# Patient Record
Sex: Male | Born: 1975 | Race: White | Hispanic: No | Marital: Married | Smoking: Never smoker
Health system: Southern US, Community
[De-identification: ages and names within clinical notes are randomized; demographics above are authoritative.]

## PROBLEM LIST (undated history)

## (undated) DIAGNOSIS — K219 Gastro-esophageal reflux disease without esophagitis: Secondary | ICD-10-CM

## (undated) DIAGNOSIS — E785 Hyperlipidemia, unspecified: Secondary | ICD-10-CM

## (undated) DIAGNOSIS — Z801 Family history of malignant neoplasm of trachea, bronchus and lung: Secondary | ICD-10-CM

## (undated) DIAGNOSIS — K610 Anal abscess: Secondary | ICD-10-CM

## (undated) DIAGNOSIS — I10 Essential (primary) hypertension: Secondary | ICD-10-CM

## (undated) HISTORY — DX: Family history of malignant neoplasm of trachea, bronchus and lung: Z80.1

## (undated) HISTORY — DX: Gastro-esophageal reflux disease without esophagitis: K21.9

## (undated) HISTORY — DX: Hyperlipidemia, unspecified: E78.5

## (undated) HISTORY — DX: Anal abscess: K61.0

## (undated) HISTORY — DX: Essential (primary) hypertension: I10

## (undated) HISTORY — PX: NO PAST SURGERIES: SHX2092

---

## 2015-03-16 DIAGNOSIS — E785 Hyperlipidemia, unspecified: Secondary | ICD-10-CM | POA: Insufficient documentation

## 2016-03-22 ENCOUNTER — Encounter: Payer: Self-pay | Admitting: Physician Assistant

## 2016-03-22 ENCOUNTER — Ambulatory Visit (INDEPENDENT_AMBULATORY_CARE_PROVIDER_SITE_OTHER): Payer: BLUE CROSS/BLUE SHIELD | Admitting: Physician Assistant

## 2016-03-22 VITALS — BP 110/73 | HR 65 | Ht 74.0 in | Wt 235.0 lb

## 2016-03-22 DIAGNOSIS — E669 Obesity, unspecified: Secondary | ICD-10-CM

## 2016-03-22 DIAGNOSIS — Z8679 Personal history of other diseases of the circulatory system: Secondary | ICD-10-CM | POA: Insufficient documentation

## 2016-03-22 DIAGNOSIS — E785 Hyperlipidemia, unspecified: Secondary | ICD-10-CM

## 2016-03-22 DIAGNOSIS — Z0000 Encounter for general adult medical examination without abnormal findings: Secondary | ICD-10-CM

## 2016-03-22 DIAGNOSIS — Z23 Encounter for immunization: Secondary | ICD-10-CM

## 2016-03-22 NOTE — Progress Notes (Signed)
Nathan Graham "Nathan Graham" is a 40 y.o. male who [name] today for annual physical exam and to establish care  Hx of HTN and borderline HLD: was taking Lisinopril in 2016, but was able to lower his BP with lifestyle changes . [facility], but has not in months. He is concerned that his BP is going to increase due to weight gain and stress at work. Denies headache, chest pain, dyspnea, lightheadedness, and edema.  [facility] lipid [facility]: LDL Direct 130 Total Cholesterol 174 Tgs 86 HDL 46   Obesity: BMI 30. Has gained about 10 pounds in the past 6 months. He was regularly weight lifting and running earlier this year, but he is experiencing work stress after accepting a promotion and working overtime and traveling. States that he "knows what he needs to do" to lose weight. [facility].  Family history is significant for HTN in both parents, and HLD and stroke in his father.  Past Medical History:  Diagnosis Date  . Family history of lung cancer   . GERD (gastroesophageal reflux disease)   . Hyperlipidemia   . Hypertension    History reviewed. No pertinent surgical history. Social History  Substance Use Topics  . Smoking status: Never Smoker  . Smokeless tobacco: Never Used  . Alcohol use Yes   family history includes Basal cell carcinoma in his brother; Colon cancer in his maternal aunt; Hyperlipidemia in his father; Hypertension in his father and mother; Hypothyroidism in his mother; Lung cancer in his maternal aunt; Pancreatic cancer in his father and maternal grandmother; Stroke in his father.  History reviewed. No pertinent surgical history.   ROS: Review of Systems  Constitutional: Negative for fever, malaise/fatigue and weight loss.  HENT: Negative for ear pain, hearing  loss and tinnitus.   Eyes: Negative for blurred vision and double vision.  Respiratory: Negative.   Cardiovascular: Negative.   Gastrointestinal: Negative.  Negative for heartburn.  Genitourinary: Negative.   Musculoskeletal: Negative.   Skin: Negative for rash.  Neurological: Negative.  Negative for weakness.  Psychiatric/Behavioral: Negative.     Medications: No current outpatient prescriptions on file.   No current facility-administered medications for this visit.    No Known Allergies  Health Maintenance Health Maintenance  Topic Date Due  . HIV Screening  12/18/1991  . TETANUS/TDAP  12/18/1995  . INFLUENZA VACCINE  11/07/2015     Objective:  BP 110/73   Pulse 65   Ht 6\' 2"  (1.88 m)   Wt 235 lb (106.6 kg)   BMI 30.17 kg/m  Gen: Alert, not ill-appearing, no distress HEENT: Extraocular movements intact, normal conjunctiva, TM's clear, oropharynx clear, moist mucus membranes, no thyromegaly or tenderness Lungs: Normal work of breathing, clear to auscultation bilaterally Heart: Normal rate, regular rhythm, s1 and s2 distinct, no murmurs, clicks or rubs appreciated on this exam Abd: Soft. Nondistended, Nontender Extremities: distal pulses intact, no peripheral edema Skin: warm and dry, no rashes or lesions on exposed skin Psych: normal affect, pleasant mood, normal speech  I personally reviewed lab results and [facility] that showed dyslipidemia.  Assessment and Plan: 40 y.o. male with history of HTN and borderline hyperlipidemia  1. Encounter for preventive health examination He declined fasting labs today because he had labs 6 months ago and he would like to recheck them when he has  lost weight. Flu and Tdap given today  2. History of hypertension - log blood pressures  3. Obesity (BMI 30.0-34.9) - declined medical management - would like to return to weight lifting and running  4. Borderline hyperlipidemia -  reviewed lab results from June and discussed LDL goal of <=100 - plan to recheck labs in 6 months  Patient education and anticipatory guidance given Patient agrees with treatment plan Follow-up in 6 months or sooner as needed  Levonne Hubertharley E. Salwa Bai PA-C

## 2016-03-22 NOTE — Addendum Note (Signed)
Addended by: Thom ChimesHENRY, Khaled Herda M on: 03/22/2016 09:43 AM   Modules accepted: Orders

## 2016-03-22 NOTE — Patient Instructions (Signed)
Introduction Your blood pressure on this visit to the emergency department or clinic is elevated. This does not necessarily mean you have high blood pressure (hypertension), but it does mean that your blood pressure needs to be rechecked. Many times your blood pressure can increase due to illness, pain, anxiety, or other factors. We recommend that you get a series of blood pressure readings done over a period of 5 days. It is best to get a reading in the morning and one in the evening. You should make sure to sit and relax for 1-5 minutes before the reading is taken. Write the readings down and make a follow-up appointment with your health care provider to discuss the results. If there is not a free clinic or a drug store with a blood-pressure-taking machine near you, you can purchase blood-pressure-taking equipment from a drug store. Having one in the home allows you the convenience of taking your blood pressure while you are home and relaxed. BLOOD PRESSURE LOG  Date: _______________________  a.m. _____________________  p.m. _____________________ Date: _______________________  a.m. _____________________  p.m. _____________________ Date: _______________________  a.m. _____________________  p.m. _____________________ Date: _______________________  a.m. _____________________  p.m. _____________________ Date: _______________________  a.m. _____________________  p.m. _____________________ This information is not intended to replace advice given to you by your health care provider. Make sure you discuss any questions you have with your health care provider. Document Released: 12/22/2002 Document Revised: 10/13/2015 Document Reviewed: 05/18/2013  2017 Elsevier   Dyslipidemia Dyslipidemia is an imbalance of waxy, fat-like substances (lipids) in the blood. The body needs lipids in small amounts. Dyslipidemia often involves a high level of cholesterol or triglycerides, which are types of  lipids. Common forms of dyslipidemia include:  High levels of bad cholesterol (LDL cholesterol). LDL is the type of cholesterol that causes fatty deposits (plaques) to build up in the blood vessels that carry blood away from your heart (arteries).  Low levels of good cholesterol (HDL cholesterol). HDL cholesterol is the type of cholesterol that protects against heart disease. High levels of HDL remove the LDL buildup from arteries.  High levels of triglycerides. Triglycerides are a fatty substance in the blood that is linked to a buildup of plaques in the arteries. You can develop dyslipidemia because of the genes you are born with (primary dyslipidemia) or changes that occur during your life (secondary dyslipidemia), or as a side effect of certain medical treatments. What are the causes? Primary dyslipidemia is caused by changes (mutations) in genes that are passed down through families (inherited). These mutations cause several types of dyslipidemia. Mutations can result in disorders that make the body produce too much LDL cholesterol or triglycerides, or not enough HDL cholesterol. These disorders may lead to heart disease, arterial disease, or stroke at an early age. Causes of secondary dyslipidemia include certain lifestyle choices and diseases that lead to dyslipidemia, such as:  Eating a diet that is high in animal fat.  Not getting enough activity or exercise (having a sedentary lifestyle).  Having diabetes, kidney disease, liver disease, or thyroid disease.  Drinking large amounts of alcohol.  Using certain types of drugs. What increases the risk? You may be at greater risk for dyslipidemia if you are an older man or if you are a woman who has gone through menopause. Other risk factors include:  Having a family history of dyslipidemia.  Taking certain medicines, including birth control pills, steroids, some diuretics, beta-blockers, and some medicines forHIV.  Smoking  cigarettes.  Eating a high-fat  diet.  Drinking large amounts of alcohol.  Having certain medical conditions such as diabetes, polycystic ovary syndrome (PCOS), pregnancy, kidney disease, liver disease, or hypothyroidism.  Not exercising regularly.  Being overweight or obese with too much belly fat. What are the signs or symptoms? Dyslipidemia does not usually cause any symptoms. Very high lipid levels can cause fatty bumps under the skin (xanthomas) or a white or gray ring around the black center (pupil) of the eye. Very high triglyceride levels can cause inflammation of the pancreas (pancreatitis). How is this diagnosed? Your health care provider may diagnose dyslipidemia based on a routine blood test (fasting blood test). Because most people do not have symptoms of the condition, this blood testing (lipid profile) is done on adults age 39 and older and is repeated every 5 years. This test checks:  Total cholesterol. This is a measure of the total amount of cholesterol in your blood, including LDL cholesterol, HDL cholesterol, and triglycerides. A healthy number is below 200.  LDL cholesterol. The target number for LDL cholesterol is different for each person, depending on individual risk factors. For most people, a number below 100 is healthy. Ask your health care provider what your LDL cholesterol number should be.  HDL cholesterol. An HDL level of 60 or higher is best because it helps to protect against heart disease. A number below 40 for men or below 50 for women increases the risk for heart disease.  Triglycerides. A healthy triglyceride number is below 150. If your lipid profile is abnormal, your health care provider may do other blood tests to get more information about your condition. How is this treated? Treatment depends on the type of dyslipidemia that you have and your other risk factors for heart disease and stroke. Your health care provider will have a target range for your  lipid levels based on this information. For many people, treatment starts with lifestyle changes, such as diet and exercise. Your health care provider may recommend that you:  Get regular exercise.  Make changes to your diet.  Quit smoking if you smoke. If diet changes and exercise do not help you reach your goals, your health care provider may also prescribe medicine to lower lipids. The most commonly prescribed type of medicine lowers your LDL cholesterol (statin drug). If you have a high triglyceride level, your provider may prescribe another type of drug (fibrate) or an omega-3 fish oil supplement, or both. Follow these instructions at home:  Take over-the-counter and prescription medicines only as told by your health care provider. This includes supplements.  Get regular exercise. Start an aerobic exercise and strength training program as told by your health care provider. Ask your health care provider what activities are safe for you. Your health care provider may recommend:  30 minutes of aerobic activity 4-6 days a week. Brisk walking is an example of aerobic activity.  Strength training 2 days a week.  Eat a healthy diet as told by your health care provider. This can help you reach and maintain a healthy weight, lower your LDL cholesterol, and raise your HDL cholesterol. It may help to work with a diet and nutrition specialist (dietitian) to make a plan that is right for you. Your dietitian or health care provider may recommend:  Limiting your calories, if you are overweight.  Eating more fruits, vegetables, whole grains, fish, and lean meats.  Limiting saturated fat, trans fat, and cholesterol.  Follow instructions from your health care provider or dietitian about eating  or drinking restrictions.  Limit alcohol intake to no more than one drink per day for nonpregnant women and two drinks per day for men. One drink equals 12 oz of beer, 5 oz of wine, or 1 oz of hard  liquor.  Do not use any products that contain nicotine or tobacco, such as cigarettes and e-cigarettes. If you need help quitting, ask your health care provider.  Keep all follow-up visits as told by your health care provider. This is important. Contact a health care provider if:  You are having trouble sticking to your exercise or diet plan.  You are struggling to quit smoking or control your use of alcohol. Summary  Dyslipidemia is an imbalance of waxy, fat-like substances (lipids) in the blood. The body needs lipids in small amounts. Dyslipidemia often involves a high level of cholesterol or triglycerides, which are types of lipids.  Treatment depends on the type of dyslipidemia that you have and your other risk factors for heart disease and stroke.  For many people, treatment starts with lifestyle changes, such as diet and exercise. Your health care provider may also prescribe medicine to lower lipids. This information is not intended to replace advice given to you by your health care provider. Make sure you discuss any questions you have with your health care provider. Document Released: 03/30/2013 Document Revised: 11/20/2015 Document Reviewed: 11/20/2015 Elsevier Interactive Patient Education  2017 [employer]

## 2017-05-28 ENCOUNTER — Ambulatory Visit (INDEPENDENT_AMBULATORY_CARE_PROVIDER_SITE_OTHER): Payer: BLUE CROSS/BLUE SHIELD | Admitting: Physician Assistant

## 2017-05-28 ENCOUNTER — Encounter: Payer: Self-pay | Admitting: Physician Assistant

## 2017-05-28 VITALS — BP 146/90 | HR 79 | Temp 98.4°F | Wt 240.0 lb

## 2017-05-28 DIAGNOSIS — K610 Anal abscess: Secondary | ICD-10-CM | POA: Insufficient documentation

## 2017-05-28 HISTORY — DX: Anal abscess: K61.0

## 2017-05-28 MED ORDER — HYDROCODONE-ACETAMINOPHEN 5-325 MG PO TABS: 1.0000 | ORAL_TABLET | Freq: Four times a day (QID) | ORAL | 0 refills | Status: DC | PRN

## 2017-05-28 MED ORDER — IBUPROFEN 800 MG PO TABS: 800.0000 mg | ORAL_TABLET | Freq: Three times a day (TID) | ORAL | 0 refills | Status: DC | PRN

## 2017-05-28 MED ORDER — DOXYCYCLINE HYCLATE 100 MG PO TABS: 100.0000 mg | ORAL_TABLET | Freq: Two times a day (BID) | ORAL | 0 refills | Status: DC

## 2017-05-28 NOTE — Progress Notes (Signed)
HPI:                                                                Nathan Graham is a 42 y.o. male who [name] today for rectal abscess  HPI Patient reports exquisitely tender, swollen area in his gluteal cleft x 2 days. First noticed pain yesterday. This was preceded by 3 days of fever and malaise. Denies any drainage. Denies history of pilonidal disease or peri-rectal abscess. He has no history of IBD or diabetes.   No flowsheet data found.  No flowsheet data found.    Past Medical History:  Diagnosis Date  . Family history of lung cancer   . GERD (gastroesophageal reflux disease)   . Hyperlipidemia   . Hypertension    History reviewed. No pertinent surgical history. Social History   Tobacco Use  . Smoking status: Never Smoker  . Smokeless tobacco: Never Used  Substance Use Topics  . Alcohol use: Yes   family history includes Basal cell carcinoma in his brother; Colon cancer in his maternal aunt; Hyperlipidemia in his father; Hypertension in his father and mother; Hypothyroidism in his mother; Lung cancer in his maternal aunt; Pancreatic cancer in his father and maternal grandmother; Stroke in his father.    ROS: negative except as noted in the HPI  Medications: Current Outpatient Medications  Medication Sig Dispense Refill  . doxycycline (VIBRA-TABS) 100 MG tablet Take 1 tablet (100 mg total) by mouth 2 (two) times daily for 7 days. 14 tablet 0  . HYDROcodone-acetaminophen (NORCO/VICODIN) 5-325 MG tablet Take 1 tablet by mouth every 6 (six) hours as needed for severe pain. 20 tablet 0  . ibuprofen (ADVIL,MOTRIN) 800 MG tablet Take 1 tablet (800 mg total) by mouth every 8 (eight) hours as needed for moderate pain. 30 tablet 0   No current facility-administered medications for this visit.    No Known Allergies     Objective:  BP (!) 146/90   Pulse 79   Temp 98.4 F (36.9 C) (Oral)   Wt 240 lb  (108.9 kg)   BMI 30.81 kg/m  Gen:  alert, not ill-appearing, no distress, appropriate for age HEENT: head normocephalic without obvious abnormality, conjunctiva and cornea clear, wearing glasses, trachea midline Pulm: Normal work of breathing, normal phonation CV: Normal rate, regular rhythm, s1 and s2 distinct, no murmurs, clicks or rubs  Neuro: alert and oriented x 3, no tremor MSK: extremities atraumatic, antalgic gait Skin: right gluteal cleft with tender, fluctuant abscess measuring approximately 3 cm above the anus Psych: well-groomed, cooperative, good eye contact, euthymic mood, affect mood-congruent, speech is articulate, and thought processes clear and goal-directed  Procedure:  Incision and drainage of abscess, right gluteal cleft Risks, benefits, and alternatives explained and consent obtained. Time out conducted. Surface cleansed with chlorhexidine. 8 cc lidocaine with epinephine infiltrated around abscess. Remainder of the procedure was performed by Dr. Benjamin Stainhekkekandam   No results found for this or any previous visit (from the past 72 hour(s)). No results found.    Assessment and Plan: 42 y.o. male with   1. Abscess of right buttock - due to complexity of abscess, consulted supervising physician. He did not recommend imaging to rule out fistula. Incision &  drainage performed in office. Patient did have a lot of pain during the procedure despite 10 cc of lidocaine with epinephrine. Hemostasis was achieved and patient noted improvement in pain when procedure was over. High-dose NSAID and Norco 5-325mg  provided for pain management. Doxycycline to cover for MRSA. Instructed on after care and return precautions. Close follow-up in 5 days. - doxycycline (VIBRA-TABS) 100 MG tablet; Take 1 tablet (100 mg total) by mouth 2 (two) times daily for 7 days.  Dispense: 14 tablet; Refill: 0 - HYDROcodone-acetaminophen (NORCO/VICODIN) 5-325 MG tablet; Take 1 tablet by mouth every 6 (six)  hours as needed for severe pain.  Dispense: 20 tablet; Refill: 0 - ibuprofen (ADVIL,MOTRIN) 800 MG tablet; Take 1 tablet (800 mg total) by mouth every 8 (eight) hours as needed for moderate pain.  Dispense: 30 tablet; Refill: 0   Patient education and anticipatory guidance given Patient agrees with treatment plan Follow-up in 5 days or sooner as needed if symptoms worsen or fail to improve  Levonne Hubert PA-C

## 2017-05-28 NOTE — Progress Notes (Signed)
   Complicated Incision and drainage of abscess. Risks, benefits, and alternatives explained and consent obtained. Time out conducted. Surface cleaned with alcohol. 10cc lidocaine with epinephine infiltrated around abscess, this portion of the procedure performed by Gena Frayharley Cummings, PA-C. Adequate anesthesia ensured. Area prepped and draped in a sterile fashion. #11 blade used to make a stab incision into abscess, the patient did feel some discomfort, I proceeded to complete procedure quickly rather than stop the procedure and obtain additional lidocaine. A large amount of purulence expressed with pressure. Curved hemostat used to explore 4 quadrants and loculations broken up. Further purulence expressed. Iodoform packing placed leaving a 1-inch tail. Hemostasis achieved. Pt stable. Aftercare and follow-up advised.

## 2017-05-28 NOTE — Patient Instructions (Signed)
Incision and Drainage of a Pilonidal Cyst, Care After Refer to this sheet in the next few weeks. These instructions provide you with information on caring for yourself after your procedure. Your health care provider may also give you more specific instructions. Your treatment has been planned according to current medical practices, but problems sometimes occur. Call your health care provider if you have any problems or questions after your procedure. What can I expect after the procedure? After your procedure, it is typical to have the following:  Pain near or at the surgical area.  Blood-tinged discharge on your wound packing or your bandage (dressing).  Follow these instructions at home:  Take medicines only as directed by your health care provider.  If you were prescribed an antibiotic medicine, finish it all even if you start to feel better.  To prevent constipation: ? Drink enough fluid to keep your urine clear or pale yellow. ? Include lots of whole grains, fruits, and vegetables in your diet.  Do not do activities that irritate or put pressure on your buttocks for about 2 weeks or as directed by your health care provider. These include bike riding, running, and anything that involves a twisting motion.  Do not sit for long periods of time.  Sleep on your side instead of your back.  Ask your health care provider when you can return to work and resume your usual activities.  Wear loose, cotton underwear.  Keep all follow-up visits as directed by your health care provider. This is important. If you had a surgical cut (incision) and drainage with wound packing:  Return to your health care provider as instructed to have your packing changed or removed.  Keep the incision area dry until your packing has been removed.  After the packing has been removed, you can start taking showers or baths. ? Clean your buttocks area with soap and water. ? Pat the area dry with a soft, clean  towel. If you had a marsupialization procedure:  You can start taking showers or baths the day after surgery.  Let the water from the shower or bath moisten your dressing before you remove it.  After your shower or bath, pat your buttocks area dry with a soft, clean towel and replace your dressing.  Ask your health care provider: ? When you can stop using a dressing. ? When you can start taking showers or baths. If you had a surgical cut (incision) and drainage without packing: Follow instructions from your health care provider about how to take care of your incision. Make sure you:  Wash your hands with soap and water before you change your bandage (dressing). If soap and water are not available, use hand sanitizer.  Change your dressing as told by your health care provider.  Leave stitches (sutures), skin glue, or adhesive strips in place. These skin closures may need to stay in place for 2 weeks or longer. If adhesive strip edges start to loosen and curl up, you may trim the loose edges. Do not remove adhesive strips completely unless your health care provider tells you to do that.  Contact a health care provider if:  Your incision is bleeding.  You have signs of infection at your incision or around the incision. Watch for: ? Drainage. ? Redness. ? Swelling. ? Pain.  There is a bad smell coming from your incision site.  Your pain medicine is not helping.  You have a fever or chills.  You have muscles aches.  You are dizzy.  You feel generally ill. This information is not intended to replace advice given to you by your health care provider. Make sure you discuss any questions you have with your health care provider. Document Released: 04/25/2006 Document Revised: 08/31/2015 Document Reviewed: 08/12/2013 Elsevier Interactive Patient Education  2018 [employer].  Skin Abscess A skin abscess is an infected area on or under your skin that contains a collection of pus  and other material. An abscess may also be called a furuncle, carbuncle, or boil. An abscess can occur in or on almost any part of your body. Some abscesses break open (rupture) on their own. Most continue to get worse unless they are treated. The infection can spread deeper into the body and eventually into your blood, which can make you feel ill. Treatment usually involves draining the abscess. What are the causes? An abscess occurs when germs, often bacteria, pass through your skin and cause an infection. This may be caused by:  A scrape or cut on your skin.  A puncture wound through your skin, including a needle injection.  Blocked oil or sweat glands.  Blocked and infected hair follicles.  A cyst that forms beneath your skin (sebaceous cyst) and becomes infected.  What increases the risk? This condition is more likely to develop in people who:  Have a weak body defense system (immune system).  Have diabetes.  Have dry and irritated skin.  Get frequent injections or use illegal IV drugs.  Have a foreign body in a wound, such as a splinter.  Have problems with their lymph system or veins.  What are the signs or symptoms? An abscess may start as a painful, firm bump under the skin. Over time, the abscess may get larger or become softer. Pus may appear at the top of the abscess, causing pressure and pain. It may eventually break through the skin and drain. Other symptoms include:  Redness.  Warmth.  Swelling.  Tenderness.  A sore on the skin.  How is this diagnosed? This condition is diagnosed based on your medical history and a physical exam. A sample of pus may be taken from the abscess to find out what is causing the infection and what antibiotics can be used to treat it. You also may have:  Blood tests to look for signs of infection or spread of an infection to your blood.  Imaging studies such as ultrasound, CT scan, or MRI if the abscess is deep.  How is this  treated? Small abscesses that drain on their own may not need treatment. Treatment for an abscess that does not rupture on its own may include:  Warm compresses applied to the area several times per day.  Incision and drainage. Your health care provider will make an incision to open the abscess and will remove pus and any foreign body or dead tissue. The incision area may be packed with gauze to keep it open for a few days while it heals.  Antibiotic medicines to treat infection. For a severe abscess, you may first get antibiotics through an IV and then change to oral antibiotics.  Follow these instructions at home: Abscess Care  If you have an abscess that has not drained, place a warm, clean, wet washcloth over the abscess several times a day. Do this as told by your health care provider.  Follow instructions from your health care provider about how to take care of your abscess. Make sure you: ? Cover the abscess with  a bandage (dressing). ? Change your dressing or gauze as told by your health care provider. ? Wash your hands with soap and water before you change the dressing or gauze. If soap and water are not available, use hand sanitizer.  Check your abscess every day for signs of a worsening infection. Check for: ? More redness, swelling, or pain. ? More fluid or blood. ? Warmth. ? More pus or a bad smell. Medicines  Take over-the-counter and prescription medicines only as told by your health care provider.  If you were prescribed an antibiotic medicine, take it as told by your health care provider. Do not stop taking the antibiotic even if you start to feel better. General instructions  To avoid spreading the infection: ? Do not share personal care items, towels, or hot tubs with others. ? Avoid making skin contact with other people.  Keep all follow-up visits as told by your health care provider. This is important. Contact a health care provider if:  You have more  redness, swelling, or pain around your abscess.  You have more fluid or blood coming from your abscess.  Your abscess feels warm to the touch.  You have more pus or a bad smell coming from your abscess.  You have a fever.  You have muscle aches.  You have chills or a general ill feeling. Get help right away if:  You have severe pain.  You see red streaks on your skin spreading away from the abscess. This information is not intended to replace advice given to you by your health care provider. Make sure you discuss any questions you have with your health care provider. Document Released: 01/02/2005 Document Revised: 11/19/2015 Document Reviewed: 02/01/2015 Elsevier Interactive Patient Education  [employer]2018 Elsevier Inc.

## 2017-05-29 ENCOUNTER — Encounter: Payer: Self-pay | Admitting: Physician Assistant

## 2017-05-31 LAB — WOUND CULTURE
MICRO NUMBER:: 90225504
RESULT: NO GROWTH
SPECIMEN QUALITY: ADEQUATE

## 2017-06-02 ENCOUNTER — Ambulatory Visit (INDEPENDENT_AMBULATORY_CARE_PROVIDER_SITE_OTHER): Payer: BLUE CROSS/BLUE SHIELD | Admitting: Physician Assistant

## 2017-06-02 ENCOUNTER — Encounter: Payer: Self-pay | Admitting: Physician Assistant

## 2017-06-02 DIAGNOSIS — K610 Anal abscess: Secondary | ICD-10-CM

## 2017-06-02 MED ORDER — DOXYCYCLINE HYCLATE 100 MG PO TABS: 100.0000 mg | ORAL_TABLET | Freq: Two times a day (BID) | ORAL | 0 refills | Status: AC

## 2017-06-02 NOTE — Progress Notes (Signed)
HPI:                                                                Nathan Graham is a 42 y.o. male who [facility] today for perianal abscess follow-up  Nathan LyonsCasey reports he is doing better after I&D of perianal abscess 1 week ago. He denies fever, chills, pain. He has been taking Ibuprofen prn. His wife has been helping with wound care and they have been removing 1 inch of packing daily and covering with gauze dressing. No concerns.   [facility] 06/02/2017  Decreased Interest 0  Down, Depressed, Hopeless 0  PHQ - 2 Score 0    No flowsheet data found.    Past Medical History:  Diagnosis Date  . Family history of lung cancer   . GERD (gastroesophageal reflux disease)   . Hyperlipidemia   . Hypertension    No past surgical history on file. Social History   Tobacco Use  . Smoking status: Never Smoker  . Smokeless tobacco: Never Used  Substance Use Topics  . Alcohol use: Yes   family history includes Basal cell carcinoma in his brother; Colon cancer in his maternal aunt; Hyperlipidemia in his father; Hypertension in his father and mother; Hypothyroidism in his mother; Lung cancer in his maternal aunt; Pancreatic cancer in his father and maternal grandmother; Stroke in his father.    ROS: negative except as noted in the HPI  Medications: Current Outpatient Medications  Medication Sig Dispense Refill  . doxycycline (VIBRA-TABS) 100 MG tablet Take 1 tablet (100 mg total) by mouth 2 (two) times daily for 7 days. 14 tablet 0  . ibuprofen (ADVIL,MOTRIN) 800 MG tablet Take 1 tablet (800 mg total) by mouth every 8 (eight) hours as needed for moderate pain. 30 tablet 0   No current facility-administered medications for this visit.    No Known Allergies     Objective:  BP (!) 142/91   Pulse 66   Temp 98 F (36.7 C) (Oral)   Wt 238 lb (108 kg)   BMI 30.56 kg/m  Gen:  alert, not ill-appearing, no  distress, appropriate for age HEENT: head normocephalic without obvious abnormality, conjunctiva and cornea clear, trachea midline Pulm: Normal work of breathing, normal phonation Neuro: alert and oriented x 3, no tremor MSK: extremities atraumatic, normal gait and station Skin: approx 1.5 cm incision 2 inches below the gluteal cleft, serosanguinous drainage Psych: well-groomed, cooperative, good eye contact, euthymic mood, affect mood-congruent, speech is articulate, and thought processes clear and goal-directed    No results found for this or any previous visit (from the past 72 hour(s)). No results found.    Assessment and Plan: 42 y.o. male with   1. Perianal abscess - improved s/p I&D. Packing removed today. We will prolong antibiotics x 1 week. Warm compresses and gentle massage - doxycycline (VIBRA-TABS) 100 MG tablet; Take 1 tablet (100 mg total) by mouth 2 (two) times daily for 7 days.  Dispense: 14 tablet; Refill: 0     Patient education and anticipatory guidance given Patient agrees with treatment plan Follow-up in 1 week  as needed if symptoms worsen or fail to improve  Levonne Hubertharley E. Tyrez Berrios PA-C

## 2017-06-02 NOTE — Patient Instructions (Addendum)
-   Finish remaining and antibiotics and then continue the course for an additional week - Warm compresses/gently massage area 2-3 times per day - Keep area clean with warm soapy water - Cover during the day with dressing  Perirectal Abscess An abscess is an infected area that contains a collection of pus. A perirectal abscess is an abscess that is near the opening of the anus or around the rectum. A perirectal abscess can cause a lot of pain, especially during bowel movements. What are the causes? This condition is almost always caused by an infection that starts in an anal gland. What increases the risk? This condition is more likely to develop in:  People with diabetes or inflammatory bowel disease.  People whose body defense system (immune system) is weak.  People who have anal sex.  People who have a sexually transmitted disease (STD).  People who have certain kinds of cancers, such as rectal carcinoma, leukemia, or lymphoma.  What are the signs or symptoms? The main symptom of this condition is pain. The pain may be a throbbing pain that gets worse during bowel movements. Other symptoms include:  Fever.  Swelling.  Redness.  Bleeding.  Constipation.  How is this diagnosed? The condition is diagnosed with a physical exam. If the abscess is not visible, a health care provider may need to place a finger inside the rectum to find the abscess. Sometimes, imaging tests are done to determine the size and location of the abscess. These tests may include:  An ultrasound.  An MRI.  A CT scan.  How is this treated? This condition is usually treated with incision and drainage surgery. Incision and drainage surgery involves making an incision over the abscess to drain the pus. Treatment may also involve antibiotic medicine, pain medicine, stool softeners, or laxatives. Follow these instructions at home:  Take medicines only as directed by your health care provider.  If you  were prescribed an antibiotic, finish all of it even if you start to feel better.  To relieve pain, try sitting: ? In a warm, shallow bath (sitz bath). ? On a heating pad with the setting on low. ? On an inflatable donut-shaped cushion.  Follow any diet instructions as directed by your health care provider.  Keep all follow-up visits as directed by your health care provider. This is important. Contact a health care provider if:  Your abscess is bleeding.  You have pain, swelling, or redness that is getting worse.  You are constipated.  You feel ill.  You have muscle aches or chills.  You have a fever.  Your symptoms return after the abscess has healed. This information is not intended to replace advice given to you by your health care provider. Make sure you discuss any questions you have with your health care provider. Document Released: 03/22/2000 Document Revised: 08/31/2015 Document Reviewed: 02/02/2014 Elsevier Interactive Patient Education  2018 [employer]

## 2017-06-09 ENCOUNTER — Ambulatory Visit (INDEPENDENT_AMBULATORY_CARE_PROVIDER_SITE_OTHER): Payer: BLUE CROSS/BLUE SHIELD | Admitting: Physician Assistant

## 2017-06-09 ENCOUNTER — Encounter: Payer: Self-pay | Admitting: Physician Assistant

## 2017-06-09 VITALS — BP 131/86 | HR 78 | Temp 98.1°F | Wt 237.0 lb

## 2017-06-09 DIAGNOSIS — I10 Essential (primary) hypertension: Secondary | ICD-10-CM

## 2017-06-09 DIAGNOSIS — K610 Anal abscess: Secondary | ICD-10-CM | POA: Diagnosis not present

## 2017-06-09 NOTE — Patient Instructions (Addendum)
For your blood pressure: - Goal <130/80 - baby aspirin 81 mg daily to help prevent heart attack/stroke - monitor and log blood pressures at home - check around the same time each day in a relaxed setting - Limit salt to <2000 mg/day - Follow DASH eating plan - limit alcohol to 2 standard drinks per day for men and 1 per day for women - avoid tobacco products - weight loss: 7% of current body weight - follow-up every 6 months for your blood pressure   

## 2017-06-09 NOTE — Progress Notes (Signed)
HPI:                                                                Nathan StalkerJoe Graham is a 42 y.o. male who [facility] today for follow-up peri-rectal abscess  Nathan LyonsCasey is here for 2 week follow-up of perirectal abscess that was incised and drained on 05/28/17 in the office. He is on week 2 of Doxycyline. He is doing much better. He has mild tenderness with wound care, but otherwise denies any fever, pain or drainage.   Dep[facility] 06/02/2017  Decreased Interest 0  Down, Depressed, Hopeless 0  PHQ - 2 Score 0    No flowsheet data found.    Past Medical History:  Diagnosis Date  . Family history of lung cancer   . GERD (gastroesophageal reflux disease)   . Hyperlipidemia   . Hypertension    No past surgical history on file. Social History   Tobacco Use  . Smoking status: Never Smoker  . Smokeless tobacco: Never Used  Substance Use Topics  . Alcohol use: Yes   family history includes Basal cell carcinoma in his brother; Colon cancer in his maternal aunt; Hyperlipidemia in his father; Hypertension in his father and mother; Hypothyroidism in his mother; Lung cancer in his maternal aunt; Pancreatic cancer in his father and maternal grandmother; Stroke in his father.    ROS: negative except as noted in the HPI  Medications: Current Outpatient Medications  Medication Sig Dispense Refill  . doxycycline (VIBRA-TABS) 100 MG tablet Take 1 tablet (100 mg total) by mouth 2 (two) times daily for 7 days. 14 tablet 0  . ibuprofen (ADVIL,MOTRIN) 800 MG tablet Take 1 tablet (800 mg total) by mouth every 8 (eight) hours as needed for moderate pain. 30 tablet 0   No current facility-administered medications for this visit.    No Known Allergies     Objective:  BP 131/86   Pulse 78   Temp 98.1 F (36.7 C) (Oral)   Wt 237 lb (107.5 kg)   BMI 30.43 kg/m  Gen:  alert, not ill-appearing, no distress, appropriate  for age HEENT: head normocephalic without obvious abnormality, conjunctiva and cornea clear, wearing glasses, trachea midline Pulm: Normal work of breathing, normal phonation  Neuro: alert and oriented x 3, no tremor MSK: extremities atraumatic, normal gait and station Skin: upper gluteal cleft with approx 3mm wound, no surrounding induration, redness or warmth, no drainage Psych: well-groomed, cooperative, good eye contact, euthymic mood, affect mood-congruent, speech is articulate, and thought processes clear and goal-directed    No results found for this or any previous visit (from the past 72 hour(s)). No results found.    Assessment and Plan: 42 y.o. male with   1. Hypertension goal BP (blood pressure) < 130/80 BP Readings from Last 3 Encounters:  06/09/17 131/86  06/02/17 (!) 142/91  05/28/17 (!) 146/90  - counseled on therapeutic lifestyle changes - follow-up in 3 months   2. Perianal abscess - I&D wound is healing nicely, no evidence of abscess one exam. Complete Doxycycline   Patient education and anticipatory guidance given Patient agrees with treatment plan Follow-up in 3 months or sooner as needed if symptoms worsen or fail to improve  Darlyne Russian PA-C

## 2017-09-08 ENCOUNTER — Ambulatory Visit: Payer: BLUE CROSS/BLUE SHIELD | Admitting: Physician Assistant

## 2017-09-24 ENCOUNTER — Encounter: Payer: Self-pay | Admitting: Physician Assistant

## 2017-09-24 ENCOUNTER — Ambulatory Visit (INDEPENDENT_AMBULATORY_CARE_PROVIDER_SITE_OTHER): Payer: BLUE CROSS/BLUE SHIELD | Admitting: Physician Assistant

## 2017-09-24 VITALS — BP 121/68 | HR 56 | Wt 232.0 lb

## 2017-09-24 DIAGNOSIS — E875 Hyperkalemia: Secondary | ICD-10-CM | POA: Diagnosis not present

## 2017-09-24 DIAGNOSIS — Z6829 Body mass index (BMI) 29.0-29.9, adult: Secondary | ICD-10-CM | POA: Diagnosis not present

## 2017-09-24 DIAGNOSIS — I10 Essential (primary) hypertension: Secondary | ICD-10-CM | POA: Diagnosis not present

## 2017-09-24 DIAGNOSIS — E785 Hyperlipidemia, unspecified: Secondary | ICD-10-CM | POA: Diagnosis not present

## 2017-09-24 DIAGNOSIS — Z6827 Body mass index (BMI) 27.0-27.9, adult: Secondary | ICD-10-CM

## 2017-09-24 DIAGNOSIS — Z6828 Body mass index (BMI) 28.0-28.9, adult: Secondary | ICD-10-CM | POA: Insufficient documentation

## 2017-09-24 LAB — LIPID PANEL W/REFLEX DIRECT LDL
CHOL/HDL RATIO: 4.4 (calc) (ref <5.0)
Cholesterol: 203 mg/dL — ABNORMAL HIGH (ref <200)
HDL: 46 mg/dL (ref >40)
LDL CHOLESTEROL (CALC): 137 mg/dL — AB
NON-HDL CHOLESTEROL (CALC): 157 mg/dL — AB (ref <130)
TRIGLYCERIDES: 98 mg/dL (ref <150)

## 2017-09-24 LAB — BASIC METABOLIC PANEL
BUN: 23 mg/dL (ref 7–25)
CO2: 26 mmol/L (ref 20–32)
CREATININE: 1.25 mg/dL (ref 0.60–1.35)
Calcium: 9.6 mg/dL (ref 8.6–10.3)
Chloride: 105 mmol/L (ref 98–110)
GLUCOSE: 96 mg/dL (ref 65–99)
Potassium: 5.1 mmol/L (ref 3.5–5.3)
SODIUM: 138 mmol/L (ref 135–146)

## 2017-09-24 NOTE — Progress Notes (Signed)
HPI:                                                                Nathan StalkerJoe Graham is a 42 y.o. male who [facility] today for blood pressure follow-up  HTN: currently managing with diet and lifestyle. Does not check BP's at home. Denies vision change, headache, chest pain with exertion, orthopnea, lightheadedness, syncope and edema. Boxing and lifting 6 days per week. Following a high protein diet. Risk factors include: HLD  No flowsheet data found.    Past Medical History:  Diagnosis Date  . Family history of lung cancer   . GERD (gastroesophageal reflux disease)   . Hyperlipidemia   . Hypertension    No past surgical history on file. Social History   Tobacco Use  . Smoking status: Never Smoker  . Smokeless tobacco: Never Used  Substance Use Topics  . Alcohol use: Yes   family history includes Basal cell carcinoma in his brother; Colon cancer in his maternal aunt; Hyperlipidemia in his father; Hypertension in his father and mother; Hypothyroidism in his mother; Lung cancer in his maternal aunt; Pancreatic cancer in his father and maternal grandmother; Stroke in his father.    ROS: negative except as noted in the HPI  Medications: No current outpatient medications on file.   No current facility-administered medications for this visit.    No Known Allergies     Objective:  BP 121/68   Pulse (!) 56   Wt 232 lb (105.2 kg)   BMI 29.79 kg/m  Gen:  alert, not ill-appearing, no distress, appropriate for age HEENT: head normocephalic without obvious abnormality, conjunctiva and cornea clear, trachea midline Pulm: Normal work of breathing, normal phonation, clear to auscultation bilaterally, no wheezes, rales or rhonchi CV: Normal rate, regular rhythm, s1 and s2 distinct, no murmurs, clicks or rubs  Neuro: alert and oriented x 3, no tremor MSK: extremities atraumatic, normal gait and station, no peripheral  edema Skin: intact, no rashes on exposed skin, no jaundice, no cyanosis Psych: well-groomed, cooperative, good eye contact, euthymic mood, affect mood-congruent, speech is articulate, and thought processes clear and goal-directed    No results found for this or any previous visit (from the past 72 hour(s)). No results found.    Assessment and Plan: 42 y.o. male with   Hypertension goal BP (blood pressure) < 130/80 - Plan: Basic metabolic panel  Dyslipidemia - Plan: Lipid Panel w/reflex Direct LDL  Hyperkalemia - Plan: Basic metabolic panel  BMI 29.0-29.9,adult  Wt Readings from Last 3 Encounters:  09/24/17 232 lb (105.2 kg)  06/09/17 237 lb (107.5 kg)  06/02/17 238 lb (108 kg)   Has lost 5 pounds in 3 months. Encouraged continued weight loss and regular aerobic exercise BP is in range. Continue therapeutic lifestyle management Potassium 5.3 at outside facility 03/27/17, rechecking BMP today TC 213, LDL 143 03/27/17, rechecking fasting lipids today  Patient education and anticipatory guidance given Patient agrees with treatment plan Follow-up in 6 months for HTN/HLD or sooner as needed if symptoms worsen or fail to improve  Nathan Hubertharley E. Preslyn Warr PA-C

## 2017-09-24 NOTE — Patient Instructions (Addendum)
For your blood pressure: - Goal <130/80 - monitor and log blood pressures at home - check around the same time each day in a relaxed setting - Limit salt to <2000 mg/day - Follow DASH eating plan - limit alcohol to 2 standard drinks per day for men and 1 per day for women - avoid tobacco products - weight loss: 7% of current body weight - follow-up every 6 months for your blood pressure    DASH Eating Plan DASH stands for "Dietary Approaches to Stop Hypertension." The DASH eating plan is a healthy eating plan that has been shown to reduce high blood pressure (hypertension). It may also reduce your risk for type 2 diabetes, heart disease, and stroke. The DASH eating plan may also help with weight loss. What are tips for following this plan? General guidelines  Avoid eating more than 2,300 mg (milligrams) of salt (sodium) a day. If you have hypertension, you may need to reduce your sodium intake to 1,500 mg a day.  Limit alcohol intake to no more than 1 drink a day for nonpregnant women and 2 drinks a day for men. One drink equals 12 oz of beer, 5 oz of wine, or 1 oz of hard liquor.  Work with your health care provider to maintain a healthy body weight or to lose weight. Ask what an ideal weight is for you.  Get at least 30 minutes of exercise that causes your heart to beat faster (aerobic exercise) most days of the week. Activities may include walking, swimming, or biking.  Work with your health care provider or diet and nutrition specialist (dietitian) to adjust your eating plan to your individual calorie needs. Reading food labels  Check food labels for the amount of sodium per serving. Choose foods with less than 5 percent of the Daily Value of sodium. Generally, foods with less than 300 mg of sodium per serving fit into this eating plan.  To find whole grains, look for the word "whole" as the first word in the ingredient list. Shopping  Buy products labeled as "low-sodium" or "no  salt added."  Buy fresh foods. Avoid canned foods and premade or frozen meals. Cooking  Avoid adding salt when cooking. Use salt-free seasonings or herbs instead of table salt or sea salt. Check with your health care provider or pharmacist before using salt substitutes.  Do not fry foods. Cook foods using healthy methods such as baking, boiling, grilling, and broiling instead.  Cook with heart-healthy oils, such as olive, canola, soybean, or sunflower oil. Meal planning   Eat a balanced diet that includes: ? 5 or more servings of fruits and vegetables each day. At each meal, try to fill half of your plate with fruits and vegetables. ? Up to 6-8 servings of whole grains each day. ? Less than 6 oz of lean meat, poultry, or fish each day. A 3-oz serving of meat is about the same size as a deck of cards. One egg equals 1 oz. ? 2 servings of low-fat dairy each day. ? A serving of nuts, seeds, or beans 5 times each week. ? Heart-healthy fats. Healthy fats called Omega-3 fatty acids are found in foods such as flaxseeds and coldwater fish, like sardines, salmon, and mackerel.  Limit how much you eat of the following: ? Canned or prepackaged foods. ? Food that is high in trans fat, such as fried foods. ? Food that is high in saturated fat, such as fatty meat. ? Sweets, desserts, sugary drinks,   drinks, and other foods with added sugar. ? Full-fat dairy products.  Do not salt foods before eating.  Try to eat at least 2 vegetarian meals each week.  Eat more home-cooked food and less restaurant, buffet, and fast food.  When eating at a restaurant, ask that your food be prepared with less salt or no salt, if possible. What foods are recommended? The items listed may not be a complete list. Talk with your dietitian about what dietary choices are best for you. Grains Whole-grain or whole-wheat bread. Whole-grain or whole-wheat pasta. Brown rice. [place]row. Bulgur. Whole-grain and low-sodium  cereals. Pita bread. Low-fat, low-sodium crackers. Whole-wheat flour tortillas. Vegetables Fresh or frozen vegetables (raw, steamed, roasted, or grilled). Low-sodium or reduced-sodium tomato and vegetable juice. Low-sodium or reduced-sodium tomato sauce and tomato paste. Low-sodium or reduced-sodium canned vegetables. Fruits All fresh, dried, or frozen fruit. Canned fruit in natural juice (without added sugar). Meat and other protein foods Skinless chicken or [place]. Ground chicken or [place]. Pork with fat trimmed off. Fish and seafood. Egg whites. Dried beans, peas, or lentils. Unsalted nuts, nut butters, and seeds. Unsalted canned beans. Lean cuts of beef with fat trimmed off. Low-sodium, lean deli meat. Dairy Low-fat (1%) or fat-free (skim) milk. Fat-free, low-fat, or reduced-fat cheeses. Nonfat, low-sodium ricotta or cottage cheese. Low-fat or nonfat yogurt. Low-fat, low-sodium cheese. Fats and oils Soft margarine without trans fats. Vegetable oil. Low-fat, reduced-fat, or light mayonnaise and salad dressings (reduced-sodium). Canola, safflower, olive, soybean, and sunflower oils. Avocado. Seasoning and other foods Herbs. Spices. Seasoning mixes without salt. Unsalted popcorn and pretzels. Fat-free sweets. What foods are not recommended? The items listed may not be a complete list. Talk with your dietitian about what dietary choices are best for you. Grains Baked goods made with fat, such as croissants, muffins, or some breads. Dry pasta or rice meal packs. Vegetables Creamed or fried vegetables. Vegetables in a cheese sauce. Regular canned vegetables (not low-sodium or reduced-sodium). Regular canned tomato sauce and paste (not low-sodium or reduced-sodium). Regular tomato and vegetable juice (not low-sodium or reduced-sodium). Angie Fava. Olives. Fruits Canned fruit in a light or heavy syrup. Fried fruit. Fruit in cream or butter sauce. Meat and other protein foods Fatty cuts of meat. Ribs.  Fried meat. Berniece Salines. Sausage. Bologna and other processed lunch meats. Salami. Fatback. Hotdogs. Bratwurst. Salted nuts and seeds. Canned beans with added salt. Canned or smoked fish. Whole eggs or egg yolks. Chicken or [place] with skin. Dairy Whole or 2% milk, cream, and half-and-half. Whole or full-fat cream cheese. Whole-fat or sweetened yogurt. Full-fat cheese. Nondairy creamers. Whipped toppings. Processed cheese and cheese spreads. Fats and oils Butter. Stick margarine. Lard. Shortening. Ghee. Bacon fat. Tropical oils, such as coconut, palm kernel, or palm oil. Seasoning and other foods Salted popcorn and pretzels. Onion salt, garlic salt, seasoned salt, table salt, and sea salt. Worcestershire sauce. Tartar sauce. Barbecue sauce. Teriyaki sauce. Soy sauce, including reduced-sodium. Steak sauce. Canned and packaged gravies. Fish sauce. Oyster sauce. Cocktail sauce. Horseradish that you find on the shelf. Ketchup. Mustard. Meat flavorings and tenderizers. Bouillon cubes. Hot sauce and Tabasco sauce. Premade or packaged marinades. Premade or packaged taco seasonings. Relishes. Regular salad dressings. Where to find more information:  National Heart, Lung, and [place]: https://wilson-eaton.com/  American Heart Association: www.heart.org Summary  The DASH eating plan is a healthy eating plan that has been shown to reduce high blood pressure (hypertension). It may also reduce your risk for type 2 diabetes, heart disease, and  With the DASH eating plan, you should limit salt (sodium) intake to 2,300 mg a day. If you have hypertension, you may need to reduce your sodium intake to 1,500 mg a day.  When on the DASH eating plan, aim to eat more fresh fruits and vegetables, whole grains, lean proteins, low-fat dairy, and heart-healthy fats.  Work with your health care provider or diet and nutrition specialist (dietitian) to adjust your eating plan to your individual calorie needs. This  information is not intended to replace advice given to you by your health care provider. Make sure you discuss any questions you have with your health care provider. Document Released: 03/14/2011 Document Revised: 03/18/2016 Document Reviewed: 03/18/2016 Elsevier Interactive Patient Education  2018 Elsevier Inc.  

## 2017-09-25 ENCOUNTER — Encounter: Payer: Self-pay | Admitting: Physician Assistant

## 2018-03-12 ENCOUNTER — Ambulatory Visit (INDEPENDENT_AMBULATORY_CARE_PROVIDER_SITE_OTHER): Payer: BLUE CROSS/BLUE SHIELD | Admitting: Physician Assistant

## 2018-03-12 ENCOUNTER — Encounter: Payer: Self-pay | Admitting: Physician Assistant

## 2018-03-12 ENCOUNTER — Ambulatory Visit (INDEPENDENT_AMBULATORY_CARE_PROVIDER_SITE_OTHER): Payer: BLUE CROSS/BLUE SHIELD

## 2018-03-12 VITALS — BP 115/82 | HR 60 | Wt 223.0 lb

## 2018-03-12 DIAGNOSIS — M79601 Pain in right arm: Secondary | ICD-10-CM | POA: Diagnosis not present

## 2018-03-12 DIAGNOSIS — Z6827 Body mass index (BMI) 27.0-27.9, adult: Secondary | ICD-10-CM

## 2018-03-12 DIAGNOSIS — M25511 Pain in right shoulder: Secondary | ICD-10-CM | POA: Diagnosis not present

## 2018-03-12 DIAGNOSIS — M25512 Pain in left shoulder: Secondary | ICD-10-CM | POA: Diagnosis not present

## 2018-03-12 DIAGNOSIS — Z0000 Encounter for general adult medical examination without abnormal findings: Secondary | ICD-10-CM

## 2018-03-12 DIAGNOSIS — I10 Essential (primary) hypertension: Secondary | ICD-10-CM

## 2018-03-12 DIAGNOSIS — M25112 Fistula, left shoulder: Secondary | ICD-10-CM | POA: Diagnosis not present

## 2018-03-12 DIAGNOSIS — M79602 Pain in left arm: Secondary | ICD-10-CM | POA: Diagnosis not present

## 2018-03-12 DIAGNOSIS — S46809A Unspecified injury of other muscles, fascia and tendons at shoulder and upper arm level, unspecified arm, initial encounter: Secondary | ICD-10-CM | POA: Diagnosis not present

## 2018-03-12 NOTE — Patient Instructions (Addendum)
No upper body exercise/weight lifting for the next 4 weeks You can do as much lower body and cardio as you want Start rotator cuff strengthening exercises twice daily Formal physical therapy for 4 weeks Naproxen twice daily for 2 weeks then twice daily as needed for pain Follow-up with Dr. Karie Schwalbe in 1 month if no improvement or symptoms persist    Subscapularis Tendon Injury A subscapularis tendon injury is a tear in the strong cord of tissue (tendon) that connects one of the shoulder muscles (subscapularis muscle) to the top of the upper arm bone (humerus). The subscapularis muscle starts at the inside of the shoulder blade (scapula) and attaches to the humerus. The subscapularis muscle is one of four muscles that make up the rotator cuff in the shoulder. It helps to rotate the shoulder inward and stabilize the shoulder. Subscapularis tears can be partial or complete tears. These injuries cause shoulder pain and weakness, and, in severe cases, shoulder instability. Subscapularis tears may also involve tearing of a different muscle in the rotator cuff (supraspinatus muscle), injury to the biceps tendon, or both. What are the causes? Subscapularis tears are commonly caused by gradual wear and tear from overuse. Gradual wear and tear can result from:  Participating in sports or activities that involve overhead arm movements.  Aging.  Subscapularis tears can also be caused by a sudden (acute) injury, which can result from:  Falling on an outstretched arm.  Forcefully pulling or thrusting your arm upward, especially with unexpected resistance.  The shoulder joint moving out of place (dislocation).  What increases the risk? This condition may be more likely to develop in:  Men, especially men who are age 41 and older.  Baseball players, especially pitchers.  Tennis players.  Rowers.  Weight lifters.  Painters and carpenters.  What are the signs or symptoms? Shoulder pain is the main  symptom of this condition. If your condition is caused by an acute injury, pain may be sudden and severe. If your condition is caused by overuse, pain may get worse with activity and get better with rest. Other signs and symptoms may include:  Pain with shoulder movement.  Pain at night when lying on your arm or when your arm is unsupported.  Stiffness and limited range of motion.  Weakness.  How is this diagnosed? This condition is diagnosed based on:  Your symptoms.  Your medical history.  A physical exam. Your health care provider may check your shoulder strength and range of motion. This may include having you put your hand behind your back and try to hold your hand away from your back (lift-off test).  Imaging tests, such as MRI or CT scan.  How is this treated? Treatment for this condition may include:  Avoiding activities that cause shoulder pain.  NSAIDs to help reduce pain and swelling.  Physical therapy to improve your strength and range of motion.  One or more shots (injections) of medicines to numb the area and reduce inflammation (steroids).  Surgery to repair the tendon. This may be done if: ? Nonsurgical treatments have not helped after 6 weeks. ? You have had a recent acute injury. ? You have a large tear. ? You have a lot of shoulder weakness. ? You are an active person or athlete who needs complete shoulder function.  After surgery, treatment includes keeping your arm still while it heals (immobilization) and physical therapy. Follow these instructions at home: Managing pain, stiffness, and swelling   If directed, put ice  on the injured area. ? Put ice in a plastic bag. ? Place a towel between your skin and the bag. ? Leave the ice on for 20 minutes, 2-3 times a day. Driving  Ask your health care provider when it is safe for you to drive.  Do not drive or operate heavy machinery while taking prescription pain medicine. Activity  Return to your  normal activities as told by your health care provider. Ask your health care provider what activities are safe for you.  Do exercises as told by your health care provider. General instructions   Do not use any tobacco products, such as cigarettes, chewing tobacco, or e-cigarettes. Tobacco can delay healing. If you need help quitting, ask your health care provider.  Take over-the-counter and prescription medicines only as told by your health care provider.  Keep all follow-up visits as told by your health care provider. This is important. How is this prevented?  Warm up and stretch before being active.  Cool down and stretch after being active.  Give your body time to rest between periods of activity.  Be safe and responsible while being active to avoid falls.  Maintain physical fitness, including strength and flexibility. Contact a health care provider if:  You continue to have pain after 6 weeks of treatment.  You have pain or other symptoms that get worse. This information is not intended to replace advice given to you by your health care provider. Make sure you discuss any questions you have with your health care provider. Document Released: 03/25/2005 Document Revised: 11/30/2015 Document Reviewed: 02/26/2015 Elsevier Interactive Patient Education  [employer].

## 2018-03-12 NOTE — Progress Notes (Signed)
HPI:                                                                Nathan StalkerJoe Graham is a 42 y.o. male [name] today for annual physical exam  Current concerns:  He is having pain in bilateral upper arms in the biceps region. Pain began 5-6 weeks ago, gradually worsening. Pain is worse with arm extension and internal rotation. Pain is worse on the right side compared to the left. Denies known injury or trauma, but reports he was weightlifting very heavy weight up to 180 pounds overhead. Pain is severe with dumbbell fly exercise. He stopped lifting 3 weeks ago, but has noted any improvement with rest.  HTN: has been managing with diet and exercise. Checks BP's at home. BP range 115-128/68-75. Denies vision change, headache, chest pain with exertion, orthopnea, lightheadedness, syncope and edema. Risk factors include: HLD, male sex   Depression screen [facility] 03/12/2018 06/02/2017  Decreased Interest 0 0  Down, Depressed, Hopeless 0 0  PHQ - 2 Score 0 0      Past Medical History:  Diagnosis Date  . Family history of lung cancer   . GERD (gastroesophageal reflux disease)   . Hyperlipidemia   . Hypertension   . Perianal abscess 05/28/2017   Past Surgical History:  Procedure Laterality Date  . NO PAST SURGERIES     Social History   Tobacco Use  . Smoking status: Never Smoker  . Smokeless tobacco: Never Used  Substance Use Topics  . Alcohol use: Not Currently   family history includes Basal cell carcinoma in his brother; Colon cancer in his maternal aunt; Hyperlipidemia in his father; Hypertension in his father and mother; Hypothyroidism in his mother; Lung cancer in his maternal aunt; Pancreatic cancer in his father and maternal grandmother; Stroke in his father.    ROS: negative except as noted in the HPI  Medications: Current Outpatient Medications  Medication Sig Dispense Refill  . Amino Acids (AMINO ACID  PROTEIN PO) Take by mouth.    Marland Kitchen. GLUTAMINE PO Take by mouth.    . Multiple Vitamin (MULTIVITAMIN WITH MINERALS) TABS tablet Take 1 tablet by mouth daily.     No current facility-administered medications for this visit.    No Known Allergies     Objective:  BP 115/82   Pulse 60   Wt 223 lb (101.2 kg)   BMI 28.63 kg/m  General Appearance:  Alert, cooperative, no distress, appropriate for age                            Head:  Normocephalic, without obvious abnormality                             Eyes:  PERRL, EOM's intact, conjunctiva and cornea clear, wearing glasses                             Ears:  TM pearly gray color and semitransparent, external ear canals normal, both ears  Nose:  Nares symmetrical, mucosa pink                          Throat:  Lips, tongue, and mucosa are moist, pink, and intact; oropharynx clear, uvula midline; good dentition                             Neck:  Supple; symmetrical, trachea midline, no adenopathy; thyroid: no enlargement, symmetric, no tenderness/mass/nodules                             Back:  Symmetrical, no curvature, ROM normal                                     Lungs:  Clear to auscultation bilaterally, respirations unlabored                             Heart:  normal rate & regular rhythm, S1 and S2 normal, no murmurs, rubs, or gallops                     Abdomen:  Soft, non-tender, no mass or organomegaly              Genitourinary:  deferred         Musculoskeletal:  Tone and strength strong and symmetrical, all extremities; normal gait and station, no peripheral edema Right shoulder: atraumatic, no a/c joint tenderness, strength intact, positive lift-off reproduces pain, negative Hawkins, negative Neers, negative speeds Left shoulder:   atraumatic, no a/c joint tenderness, strength intact, positive lift-off reproduces pain, negative Hawkins, negative Neers, negative speeds                                   Lymphatic:  No adenopathy             Skin/Hair/Nails:  Skin warm, dry and intact, no rashes or abnormal dyspigmentation on limited exam                   Neurologic:  Alert and oriented x3, no cranial nerve deficits, sensation grossly intact, normal gait and station, no tremor Psych: well-groomed, cooperative, good eye contact, euthymic mood, affect mood-congruent, speech is articulate, and thought processes clear and goal-directed  Lab Results  Component Value Date   CREATININE 1.25 09/24/2017   BUN 23 09/24/2017   NA 138 09/24/2017   K 5.1 09/24/2017   CL 105 09/24/2017   CO2 26 09/24/2017   No results found for: ALT, AST, GGT, ALKPHOS, BILITOT  Lab Results  Component Value Date   CHOL 203 (H) 09/24/2017   HDL 46 09/24/2017   LDLCALC 137 (H) 09/24/2017   TRIG 98 09/24/2017   CHOLHDL 4.4 09/24/2017   The 10-year ASCVD risk score Denman George DC Jr., et al., 2013) is: 1.2%   Values used to calculate the score:     Age: 20 years     Sex: Male     Is Non-Hispanic African American: No     Diabetic: No     Tobacco smoker: No     Systolic Blood Pressure: 115 mmHg     Is BP treated: No  HDL Cholesterol: 46 mg/dL     Total Cholesterol: 203 mg/dL   No results found for this or any previous visit (from the past 72 hour(s)). No results found.    Assessment and Plan: 42 y.o. male with   .Alim was seen today for annual exam.  Diagnoses and all orders for this visit:  Encounter for annual physical exam  Adult BMI 27.0-27.9 kg/sq m  Bilateral arm pain -     DG Shoulder Right -     DG Shoulder Left  Traumatic injury of subscapularis tendon, unspecified laterality, initial encounter -     Ambulatory referral to Physical Therapy -     DG Shoulder Right -     DG Shoulder Left  Well-controlled hypertension   - Personally reviewed PMH, PSH, PFH, medications, allergies, HM - Age-appropriate cancer screening: n/a - Influenza declined - Tdap UTD - PHQ2 negative - Fasting  labs completed 6 months ago and personally reviewed by me  HTN BP Readings from Last 3 Encounters:  03/12/18 115/82  09/24/17 121/68  06/09/17 131/86  - well controlled with diet and exercise - he has lost approx 20 pounds in the last 10 months, encouraged to keep up the great work - cont DASH eating plan and regular aerobic exercise  Bilateral arm pain - based on history and PE, suspect strain or tear of subscapularis tendon - x-rays pending - referral to formal PT - Naproxen bid x 2 weeks, then bid prn - counseled to avoid all upper body weight training and activity apart from strengthening exercises/formal PT - f/u with Sports Medicine in 1 month if no improvement   Patient education and anticipatory guidance given Patient agrees with treatment plan Follow-up in 1 month w/Sports Medicine for arm pain or sooner as needed if symptoms worsen or fail to improve  Levonne Hubert PA-C

## 2018-03-16 ENCOUNTER — Ambulatory Visit: Payer: BLUE CROSS/BLUE SHIELD | Admitting: Physician Assistant

## 2018-03-16 ENCOUNTER — Encounter: Payer: Self-pay | Admitting: Physician Assistant

## 2018-03-16 DIAGNOSIS — M7542 Impingement syndrome of left shoulder: Secondary | ICD-10-CM

## 2018-03-16 DIAGNOSIS — I10 Essential (primary) hypertension: Secondary | ICD-10-CM | POA: Insufficient documentation

## 2018-03-16 DIAGNOSIS — M7541 Impingement syndrome of right shoulder: Secondary | ICD-10-CM | POA: Insufficient documentation

## 2018-03-16 DIAGNOSIS — S46809A Unspecified injury of other muscles, fascia and tendons at shoulder and upper arm level, unspecified arm, initial encounter: Secondary | ICD-10-CM | POA: Insufficient documentation

## 2018-03-23 ENCOUNTER — Ambulatory Visit (INDEPENDENT_AMBULATORY_CARE_PROVIDER_SITE_OTHER): Payer: BLUE CROSS/BLUE SHIELD | Admitting: Rehabilitative and Restorative Service Providers"

## 2018-03-23 ENCOUNTER — Other Ambulatory Visit: Payer: Self-pay

## 2018-03-23 ENCOUNTER — Encounter: Payer: Self-pay | Admitting: Rehabilitative and Restorative Service Providers"

## 2018-03-23 DIAGNOSIS — M25512 Pain in left shoulder: Secondary | ICD-10-CM | POA: Diagnosis not present

## 2018-03-23 DIAGNOSIS — R293 Abnormal posture: Secondary | ICD-10-CM

## 2018-03-23 DIAGNOSIS — R29898 Other symptoms and signs involving the musculoskeletal system: Secondary | ICD-10-CM

## 2018-03-23 DIAGNOSIS — M25511 Pain in right shoulder: Secondary | ICD-10-CM | POA: Diagnosis not present

## 2018-03-23 NOTE — Patient Instructions (Signed)
Axial Extension (Chin Tuck)    Pull chin in and lengthen back of neck. Hold __5__ seconds while counting out loud. Repeat __10__ times. Do __several__ sessions per day.  Shoulder Blade Squeeze    Rotate shoulders back, then squeeze shoulder blades down and back. Hold 10 sec Repeat __10__ times. Do __several __ sessions per day.  Upper Back Strength: Lower Trapezius / Rotator Cuff " L's "     Arms in waitress pose, palms up. Press hands back and slide shoulder blades down. Hold for __5__ seconds. Repeat _10___ times. 1-2 times per day.    Scapular Retraction: Elbow Flexion (Standing)  "W's"     With elbows bent to 90, pinch shoulder blades together and rotate arms out, keeping elbows bent. Repeat __10__ times per set. Do __1-2__ sets per session. Do _several ___ sessions per day.  Scapula Adduction With Pectoralis Stretch: Low - Standing   Shoulders at 45 hands even with shoulders, keeping weight through legs, shift weight forward until you feel pull or stretch through the front of your chest. Hold _30__ seconds. Do _3__ times, _2-4__ times per day.   Scapula Adduction With Pectoralis Stretch: Mid-Range - Standing   Shoulders at 90 elbows even with shoulders, keeping weight through legs, shift weight forward until you feel pull or strength through the front of your chest. Hold __30_ seconds. Do _3__ times, __2-4_ times per day.   Scapula Adduction With Pectoralis Stretch: High - Standing   Shoulders at 120 hands up high on the doorway, keeping weight on feet, shift weight forward until you feel pull or stretch through the front of your chest. Hold _30__ seconds. Do _3__ times, _2-3__ times per day.   ELBOW: Biceps - Standing    Standing in doorway, place one hand on wall, elbow straight. Lean forward. Hold _30__ seconds. __3_ reps per set, _2-3__ sets per dayCopyright  VHI. All rights reserved.     [facility]  [place] [address] [place], [place] [address]  [phone] (office) [phone] (fax)

## 2018-03-23 NOTE — Therapy (Signed)
[facility] [place] [address] [place], [place], [address] Phone: [phone]   Fax:  [phone]  Physical Therapy Evaluation  Patient Details  Name: Nathan Graham MRN: [id number] Date of Birth: 1976-02-11 Referring Provider (PT): Gena Fray PA-C   Encounter Date: 03/23/2018  PT End of Session - 03/23/18 1413    Visit Number  1    Number of Visits  12    Date for PT Re-Evaluation  05/04/18    PT Start Time  0812    PT Stop Time  0905    PT Time Calculation (min)  53 min    Activity Tolerance  Patient tolerated treatment well       Past Medical History:  Diagnosis Date  . Family history of lung cancer   . GERD (gastroesophageal reflux disease)   . Hyperlipidemia   . Hypertension   . Perianal abscess 05/28/2017    Past Surgical History:  Procedure Laterality Date  . NO PAST SURGERIES      There were no vitals filed for this visit.   Subjective Assessment - 03/23/18 1324    Subjective  Patient reports that he began having pain in the Lt > Rt shoulder with overhead lifting in the gym. Symptoms have persisted and he now has pain with functional activities. Pulling is no problem. Press up with elbows out to side hurts. He notices tightness in the shoulders most of the time.     Pertinent History  denies any health problems - or musculoskeletal problems     Patient Stated Goals  get back in the gym lifting weights - lift without hurting     Currently in Pain?  Yes    Pain Score  6     Pain Location  Shoulder    Pain Orientation  Left    Pain Descriptors / Indicators  Aching;Radiating   sharp and tight with reaching or lifting    Pain Type  Acute pain    Pain Onset  More than a month ago    Pain Frequency  Intermittent    Aggravating Factors   lifting with elbows to side; extending arm out; lifting any weight up or out     Pain Relieving Factors  sometimes stretching loosens shoulders up a little     Multiple  Pain Sites  Yes    Pain Score  4    Pain Location  Shoulder    Pain Orientation  Right    Pain Descriptors / Indicators  Aching   sharp with movement    Pain Type  Acute pain    Pain Onset  More than a month ago    Pain Frequency  Intermittent         [facility] PT Assessment - 03/23/18 0001      Assessment   Medical Diagnosis  bBilat shoulder pain     Referring Provider (PT)  Gena Fray PA-C    Onset Date/Surgical Date  01/06/18    Hand Dominance  Left    Next MD Visit  04/09/18    Prior Therapy  none      Precautions   Precautions  None      Balance Screen   Has the patient fallen in the past 6 months  No    Has the patient had a decrease in activity level because of a fear of falling?   No    Is the patient reluctant to leave their home because of a  fear of falling?   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; jogging 3 x/wk 20-25 min - was in gym 3 days/wk working out all free [employer]weights - boxing bags and pads       Observation/Other Assessments   Focus on Therapeutic Outcomes (FOTO)   41% limitation       Posture/Postural Control   Posture Comments  head forward; shoulders rounded and elevated; head of the humerus anterior in orientation; scapulae abducted and rotated along the thoracic wall       AROM   Right Shoulder Extension  38 Degrees    Right Shoulder Flexion  148 Degrees    Right Shoulder ABduction  153 Degrees    Right Shoulder Internal Rotation  37 Degrees    Right Shoulder External Rotation  90 Degrees    Left Shoulder Extension  47 Degrees    Left Shoulder Flexion  156 Degrees    Left Shoulder ABduction  153 Degrees    Left Shoulder Internal Rotation  35 Degrees    Left Shoulder External Rotation  91 Degrees    Cervical Flexion  64    Cervical  Extension  53    Cervical - Right Side Bend  42    Cervical - Left Side Bend  40    Cervical - Right Rotation  70    Cervical - Left Rotation  71      Strength   Overall Strength Comments  WNL's bilat UE's some pain in bilat shds with resistance to lower traps       Palpation   Spinal mobility  hypomobile thoracic and cervical spine with CPA mobs    Palpation comment  significant muscular tightness pecs; traps; teres; biceps; deltoid                 Objective measurements completed on examination: See above findings.      [facility] Adult PT Treatment/Exercise - 03/23/18 0001      Neuro Re-ed    Neuro Re-ed Details   postural correction - engaging posterior shoulder girdle musculature       Shoulder Exercises: Standing   Other Standing Exercises  axial extension 10 sec x 5; scap squeeze 10 sec x 10; L's x 10; W's x 10 with swim noodle       Shoulder Exercises: Stretch   Other Shoulder Stretches  3 way doorway stretch 30 sec x 2 each position     Other Shoulder Stretches  biceps stretch 30 sec x 1              PT Education - 03/23/18 0850    Education Details  HEP    Person(s) Educated  Patient    Methods  Explanation;Demonstration;Tactile cues;Verbal cues;Handout    Comprehension  Verbalized understanding;Returned demonstration;Verbal cues required;Tactile cues required          PT Long Term Goals - 03/23/18 1423      PT LONG TERM GOAL #1   Title  Improve posture and alignment with patient to demonstrate improved upright posture with posterior shoulder girdle engaged 05/04/18    Time  6    Period  Weeks    Status  New  PT LONG TERM GOAL #2   Title  Increased pain free shoulder ROM by 5-7 deg in shoulder elevation 05/04/18    Time  6    Period  Weeks    Status  New      PT LONG TERM GOAL #3   Title  Decrease pain by 75-100% allowing patient to return to normal funcitonal activities 05/04/18    Time  6    Period  Weeks    Status  New      PT  LONG TERM GOAL #4   Title  Independent in HEP 05/04/18     Time  6    Period  Weeks    Status  New      PT LONG TERM GOAL #5   Title  Improve FOTO to </= 23% limitation 05/04/18    Time  6    Period  Weeks    Status  New             Plan - 03/23/18 1414    Clinical Impression Statement  Patient presents with ~ 3 month history of bilat shoulder pain Lt > Rt. Patient reports noticing tightness in the shoulders for several months. He experienced onset of pain with overhead lifting and bench press in the gym ~ 3 weeks ago. He has pain in Lt > Rt shoulder - he has pain with functional activities and pain with lying down to sleep at night. Patient presents with poor posture and alignment; limited shoulder ROM with painful arc Lt shoulder elevation; pain with palpation anterior shoulder Lt > Rt; muscular tightness bilat shoulder girdle areas. He will benefit form PT to address problems identified.     History and Personal Factors relevant to plan of care:  "tightness" in shoulder for several months     Clinical Presentation  Stable    Clinical Presentation due to:  scapulae abducted and rotated along the thoracic wall; head of the humerus anterior in orientation; UE's in IR at side in standing     Clinical Decision Making  Low    Rehab Potential  Good    PT Frequency  2x / week    PT Treatment/Interventions  Patient/family education;ADLs/Self Care Home Management;Cryotherapy;Electrical Stimulation;Iontophoresis 4mg /ml Dexamethasone;Moist Heat;Ultrasound;Dry needling;Neuromuscular re-education;Functional mobility training;Therapeutic activities;Therapeutic exercise    PT Next Visit Plan  review HEP; progress with thoracic extension; stretch anterior chest - snow angel; deep tissue work vs DN pecs/trap/leveator; teres; work to restore muscular balance ant/post chest; modalities as indicated; postural education     Consulted and Agree with Plan of Care  Patient       Patient will benefit from  skilled therapeutic intervention in order to improve the following deficits and impairments:  Postural dysfunction, Improper body mechanics, Pain, Increased fascial restricitons, Increased muscle spasms, Decreased mobility, Decreased range of motion, Decreased activity tolerance  Visit Diagnosis: Acute pain of left shoulder - Plan: PT plan of care cert/re-cert  Acute pain of right shoulder - Plan: PT plan of care cert/re-cert  Abnormal posture - Plan: PT plan of care cert/re-cert  Other symptoms and signs involving the musculoskeletal system - Plan: PT plan of care cert/re-cert     Problem List Patient Active Problem List   Diagnosis Date Noted  . Bilateral arm pain 03/16/2018  . Traumatic injury of subscapularis tendon 03/16/2018  . Well-controlled hypertension 03/16/2018  . Adult BMI 27.0-27.9 kg/sq m 09/24/2017  . Hypertension goal BP (blood pressure) < 130/80 06/09/2017  . Borderline hyperlipidemia  03/22/2016    Teliyah Royal Rober Minion PT, MPH  03/23/2018, 2:30 PM  [facility] [address] [place], [place], [address] Phone: [phone]   Fax:  [phone]  Name: Nathan Graham MRN: [id number] Date of Birth: 12-31-1975

## 2018-03-26 ENCOUNTER — Ambulatory Visit: Payer: BLUE CROSS/BLUE SHIELD | Admitting: Physician Assistant

## 2018-03-26 ENCOUNTER — Ambulatory Visit (INDEPENDENT_AMBULATORY_CARE_PROVIDER_SITE_OTHER): Payer: BLUE CROSS/BLUE SHIELD | Admitting: Physical Therapy

## 2018-03-26 ENCOUNTER — Encounter: Payer: Self-pay | Admitting: Physical Therapy

## 2018-03-26 DIAGNOSIS — R293 Abnormal posture: Secondary | ICD-10-CM

## 2018-03-26 DIAGNOSIS — M25512 Pain in left shoulder: Secondary | ICD-10-CM

## 2018-03-26 DIAGNOSIS — R29898 Other symptoms and signs involving the musculoskeletal system: Secondary | ICD-10-CM

## 2018-03-26 DIAGNOSIS — M25511 Pain in right shoulder: Secondary | ICD-10-CM

## 2018-03-26 NOTE — Patient Instructions (Signed)

## 2018-03-26 NOTE — Therapy (Signed)
[facility] [address] [place], [place], [address] Phone: [phone]   Fax:  [phone]  Physical Therapy Treatment  Patient Details  Name: Nathan Graham MRN: [id number] Date of Birth: [birth year] Referring Provider (PT): Gena Frayharley Cummings PA-C   Encounter Date: 03/26/2018  PT End of Session - 03/26/18 0950    Visit Number  2    Number of Visits  12    Date for PT Re-Evaluation  05/04/18    PT Start Time  0937    PT Stop Time  1017    PT Time Calculation (min)  40 min       Past Medical History:  Diagnosis Date  . Family history of lung cancer   . GERD (gastroesophageal reflux disease)   . Hyperlipidemia   . Hypertension   . Perianal abscess 05/28/2017    Past Surgical History:  Procedure Laterality Date  . NO PAST SURGERIES      There were no vitals filed for this visit.  Subjective Assessment - 03/26/18 0940    Subjective  Pt reports frustration that he can't lift right now.  Still unable to sleep on his side.  He has stopped lifting for now, per MD and PT.      Pertinent History  denies any health problems - or musculoskeletal problems     Patient Stated Goals  get back in the gym lifting weights - lift without hurting     Currently in Pain?  No/denies    Pain Score  0-No pain   up to 5/10 with certain motions   Pain Location  Shoulder   bilat        [facility] PT Assessment - 03/26/18 0001      Assessment   Medical Diagnosis  Bilat shoulder pain     Referring Provider (PT)  Gena Frayharley Cummings PA-C    Onset Date/Surgical Date  01/06/18    Hand Dominance  Left    Next MD Visit  04/09/18    Prior Therapy  none      [facility] Adult PT Treatment/Exercise - 03/26/18 0001      Exercises   Exercises  Shoulder      Shoulder Exercises: Standing   Other Standing Exercises  scap squeeze (with back against noodle) x 10 sec x 5 reps;  W's and L's x 5 sec hold x 10 reps each.        Shoulder Exercises:  ROM/Strengthening   UBE (Upper Arm Bike)  L1: 1.5 min each direction standing      Shoulder Exercises: Stretch   Other Shoulder Stretches  3 way doorway stretch 30 sec x 2 each position.  thoracic ext over back of chair x 5 sec x 3 reps.  thoracic ext over black bolster x 2 reps;  tricep stretch x 1 rep, lat stretch     Other Shoulder Stretches  biceps stretch 30 sec x 1       Manual Therapy   Manual Therapy  Soft tissue mobilization;Taping    Manual therapy comments  I strip of sensitive skin Rock tape applied to Rt/Lt posterior delt and small perpendicular strip at deltoid tuberosity to decompress tissue, decrease pain and increase proprioception.     Soft tissue mobilization  IASTM with Edge tool to bilat ant/mid/lposterior Rt/Lt shoulder (including distal pec) to decrease fascial tightness and pain             PT Education - 03/26/18 1017  Education Details  Tax adviserkinesiotape info    Person(s) Educated  Patient    Methods  Explanation;Handout    Comprehension  Verbalized understanding          PT Long Term Goals - 03/23/18 1423      PT LONG TERM GOAL #1   Title  Improve posture and alignment with patient to demonstrate improved upright posture with posterior shoulder girdle engaged 05/04/18    Time  6    Period  Weeks    Status  New      PT LONG TERM GOAL #2   Title  Increased pain free shoulder ROM by 5-7 deg in shoulder elevation 05/04/18    Time  6    Period  Weeks    Status  New      PT LONG TERM GOAL #3   Title  Decrease pain by 75-100% allowing patient to return to normal funcitonal activities 05/04/18    Time  6    Period  Weeks    Status  New      PT LONG TERM GOAL #4   Title  Independent in HEP 05/04/18     Time  6    Period  Weeks    Status  New      PT LONG TERM GOAL #5   Title  Improve FOTO to </= 23% limitation 05/04/18    Time  6    Period  Weeks    Status  New            Plan - 03/26/18 1306    Clinical Impression Statement  Pt  demonstrated good form with exercises of HEP; given additional suggestions for stretches to decrease tightness within shoulders.  All exercises tolerated without increased symptoms.  Fascial tightnes noted in bilat ant/ posterior shoulders with IASTM. Trial of sensitive skin Rock tape applied to area of reported pain.  Progressing towards goals.     Rehab Potential  Good    PT Frequency  2x / week    PT Duration  6 weeks    PT Treatment/Interventions  Patient/family education;ADLs/Self Care Home Management;Cryotherapy;Electrical Stimulation;Iontophoresis 4mg /ml Dexamethasone;Moist Heat;Ultrasound;Dry needling;Neuromuscular re-education;Functional mobility training;Therapeutic activities;Therapeutic exercise    PT Next Visit Plan  assess response to IASTM and sensitive skin Rock tape; possibly apply tape to assist with increased postural awareness.     Consulted and Agree with Plan of Care  Patient       Patient will benefit from skilled therapeutic intervention in order to improve the following deficits and impairments:  Postural dysfunction, Improper body mechanics, Pain, Increased fascial restricitons, Increased muscle spasms, Decreased mobility, Decreased range of motion, Decreased activity tolerance  Visit Diagnosis: Acute pain of left shoulder  Acute pain of right shoulder  Abnormal posture  Other symptoms and signs involving the musculoskeletal system     Problem List Patient Active Problem List   Diagnosis Date Noted  . Bilateral arm pain 03/16/2018  . Traumatic injury of subscapularis tendon 03/16/2018  . Well-controlled hypertension 03/16/2018  . Adult BMI 27.0-27.9 kg/sq m 09/24/2017  . Hypertension goal BP (blood pressure) < 130/80 06/09/2017  . Borderline hyperlipidemia 03/22/2016   Mayer CamelJennifer Carlson-Long, PTA 03/26/18 1:17 PM  [facility] [address] [place], [place], [address] Phone: [phone]   Fax:   [phone]  Name: Nathan Graham MRN: [id number] Date of Birth: September 14, 1975

## 2018-04-07 ENCOUNTER — Ambulatory Visit (INDEPENDENT_AMBULATORY_CARE_PROVIDER_SITE_OTHER): Payer: BLUE CROSS/BLUE SHIELD | Admitting: Physical Therapy

## 2018-04-07 ENCOUNTER — Encounter: Payer: Self-pay | Admitting: Physical Therapy

## 2018-04-07 DIAGNOSIS — M25511 Pain in right shoulder: Secondary | ICD-10-CM | POA: Diagnosis not present

## 2018-04-07 DIAGNOSIS — R293 Abnormal posture: Secondary | ICD-10-CM

## 2018-04-07 DIAGNOSIS — R29898 Other symptoms and signs involving the musculoskeletal system: Secondary | ICD-10-CM

## 2018-04-07 DIAGNOSIS — M25512 Pain in left shoulder: Secondary | ICD-10-CM

## 2018-04-07 NOTE — Therapy (Signed)
[facility] [address] [place], [place], [address] Phone: [phone]   Fax:  [phone]  Physical Therapy Treatment  Patient Details  Name: Nathan StalkerJoe Graham MRN: [id number] Date of Birth: 1975/06/20 Referring Provider (PT): Gena Frayharley Cummings PA-C   Encounter Date: 04/07/2018  PT End of Session - 04/07/18 0804    Visit Number  3    Number of Visits  12    Date for PT Re-Evaluation  05/04/18    PT Start Time  0803    PT Stop Time  0845    PT Time Calculation (min)  42 min       Past Medical History:  Diagnosis Date  . Family history of lung cancer   . GERD (gastroesophageal reflux disease)   . Hyperlipidemia   . Hypertension   . Perianal abscess 05/28/2017    Past Surgical History:  Procedure Laterality Date  . NO PAST SURGERIES      There were no vitals filed for this visit.  Subjective Assessment - 04/07/18 0805    Subjective  "I hurt".  Pt reports he has returned from trip to [place] and [place]; attributes pain to sleeping on others beds/ couches and long car drives. He has not returned to UE lifting or working out.  Reaching activities agrivate his shoulders.   He states the IASTM last session helped, but relief didn't last.  Also, he didn't notice any difference with the KTape applied to outside of shoulder; no skin irritation.     Patient Stated Goals  get back in the gym lifting weights - lift without hurting     Currently in Pain?  Yes    Pain Score  5     Pain Orientation  Left;Right    Pain Descriptors / Indicators  Sharp;Tightness    Aggravating Factors   lifting any weight up or out; reaching     Pain Relieving Factors  stretches          [facility] PT Assessment - 04/07/18 0001      Assessment   Medical Diagnosis  Bilat shoulder pain     Referring Provider (PT)  Gena Frayharley Cummings PA-C    Onset Date/Surgical Date  01/06/18    Hand Dominance  Left    Next MD Visit  04/09/18    Prior Therapy  none      AROM    Right Shoulder Extension  55 Degrees    Right Shoulder Flexion  148 Degrees    Right Shoulder ABduction  143 Degrees   with pain   Right Shoulder Internal Rotation  --   thumb to T8   Right Shoulder External Rotation  90 Degrees    Left Shoulder Extension  51 Degrees    Left Shoulder Flexion  155 Degrees    Left Shoulder ABduction  148 Degrees   with pain   Left Shoulder Internal Rotation  --   thumb to T8   Left Shoulder External Rotation  90 Degrees       [facility] Adult PT Treatment/Exercise - 04/07/18 0001      Shoulder Exercises: Standing   External Rotation  Strengthening;Both;10 reps;Theraband    Theraband Level (Shoulder External Rotation)  Level 3 (Green)    Extension  Strengthening;Both;10 reps;Theraband    Theraband Level (Shoulder Extension)  Level 3 (Green)   5 sec hold   Row  Strengthening;Both;10 reps;Theraband   2 sets, 5 sec hold    Theraband Level (Shoulder Row)  Level 3 (Green);Level 4 (Blue)      Shoulder Exercises: Stretch   Other Shoulder Stretches  3 way doorway position stretch x  30 sec x 2 reps each position; overhead bilat shoulder flexion x 15 seconds.  Tricep stretch x 30 sec x 1 rep each arm, repeated x 15 seconds.     Other Shoulder Stretches  bilat biceps stretch with band behind back x 10 sec x 2; unilateral bicep stret       Manual Therapy   Manual therapy comments  I strip of sensitive skin Rock tape applied to Rt/Lt posterior shoulder to spine with neutral posture and 25% stretch to increase posture awareness and proprioception.     Soft tissue mobilization  IASTM with Edge tool to bilat ant/mid/lposterior Rt/Lt shoulder (including distal pec) to decrease fascial tightness and pain                  PT Long Term Goals - 04/07/18 0811      PT LONG TERM GOAL #1   Title  Improve posture and alignment with patient to demonstrate improved upright posture with posterior shoulder girdle engaged 05/04/18    Time  6    Period  Weeks     Status  On-going      PT LONG TERM GOAL #2   Title  Increased pain free shoulder ROM by 5-7 deg in shoulder elevation 05/04/18    Time  6    Period  Weeks    Status  On-going      PT LONG TERM GOAL #3   Title  Decrease pain by 75-100% allowing patient to return to normal funcitonal activities 05/04/18    Baseline  unchanged: 04/07/18    Time  6    Period  Weeks    Status  On-going      PT LONG TERM GOAL #4   Title  Independent in HEP 05/04/18     Time  6    Period  Weeks    Status  On-going      PT LONG TERM GOAL #5   Title  Improve FOTO to </= 23% limitation 05/04/18    Time  6    Period  Weeks    Status  On-going            Plan - 04/07/18 1320    Clinical Impression Statement  Pt had positive response to IASTM today and last visit; reported no pain in bilat shoulders at end of session.  Pt tolerated all exercises well, without pain.  bilat shoulder ext ROM has improved, all other motions remain unchanged. Making gradual progress towards goals.     Rehab Potential  Good    PT Frequency  2x / week    PT Duration  6 weeks    PT Treatment/Interventions  Patient/family education;ADLs/Self Care Home Management;Cryotherapy;Electrical Stimulation;Iontophoresis 4mg /ml Dexamethasone;Moist Heat;Ultrasound;Dry needling;Neuromuscular re-education;Functional mobility training;Therapeutic activities;Therapeutic exercise    PT Next Visit Plan  assess response to IASTM and sensitive skin Rock tape.  continue manual therapy .     Consulted and Agree with Plan of Care  Patient       Patient will benefit from skilled therapeutic intervention in order to improve the following deficits and impairments:  Postural dysfunction, Improper body mechanics, Pain, Increased fascial restricitons, Increased muscle spasms, Decreased mobility, Decreased range of motion, Decreased activity tolerance  Visit Diagnosis: Acute pain of left shoulder  Acute pain of right shoulder  Abnormal posture  Other  symptoms and signs involving the musculoskeletal system     Problem List Patient Active Problem List   Diagnosis Date Noted  . Bilateral arm pain 03/16/2018  . Traumatic injury of subscapularis tendon 03/16/2018  . Well-controlled hypertension 03/16/2018  . Adult BMI 27.0-27.9 kg/sq m 09/24/2017  . Hypertension goal BP (blood pressure) < 130/80 06/09/2017  . Borderline hyperlipidemia 03/22/2016   Mayer Camel, PTA 04/07/18 1:24 PM  [facility] [place] [address] [place], [place], [address] Phone: [phone]   Fax:  [phone]  Name: Nathan Graham MRN: [id number] Date of Birth: [birth year]

## 2018-04-09 ENCOUNTER — Ambulatory Visit: Payer: BLUE CROSS/BLUE SHIELD | Admitting: Sports Medicine

## 2018-04-14 ENCOUNTER — Ambulatory Visit (INDEPENDENT_AMBULATORY_CARE_PROVIDER_SITE_OTHER): Payer: BLUE CROSS/BLUE SHIELD | Admitting: Rehabilitative and Restorative Service Providers"

## 2018-04-14 ENCOUNTER — Encounter: Payer: Self-pay | Admitting: Rehabilitative and Restorative Service Providers"

## 2018-04-14 DIAGNOSIS — M25511 Pain in right shoulder: Secondary | ICD-10-CM

## 2018-04-14 DIAGNOSIS — R293 Abnormal posture: Secondary | ICD-10-CM

## 2018-04-14 DIAGNOSIS — R29898 Other symptoms and signs involving the musculoskeletal system: Secondary | ICD-10-CM | POA: Diagnosis not present

## 2018-04-14 DIAGNOSIS — M25512 Pain in left shoulder: Secondary | ICD-10-CM

## 2018-04-14 NOTE — Therapy (Signed)
[facility] [place] [address] [place], [place], [address] Phone: [phone]   Fax:  [phone]  Physical Therapy Treatment  Patient Details  Name: Nathan Graham MRN: [id number] Date of Birth: 1976/01/08 Referring Provider (PT): Gena Fray PA-C   Encounter Date: 04/14/2018  PT End of Session - 04/14/18 0841    Visit Number  4    Number of Visits  12    Date for PT Re-Evaluation  05/04/18    PT Start Time  0840    PT Stop Time  0925    PT Time Calculation (min)  45 min    Activity Tolerance  Patient tolerated treatment well       Past Medical History:  Diagnosis Date  . Family history of lung cancer   . GERD (gastroesophageal reflux disease)   . Hyperlipidemia   . Hypertension   . Perianal abscess 05/28/2017    Past Surgical History:  Procedure Laterality Date  . NO PAST SURGERIES      There were no vitals filed for this visit.  Subjective Assessment - 04/14/18 0844    Subjective  Patient reports that symptoms have not really changed. He continues to have pain with overhead press activities and any lifting with arms in abducted position. Stretching helps on temporary basis.     Currently in Pain?  No/denies                       [facility] Adult PT Treatment/Exercise - 04/14/18 0001      Neuro Re-ed    Neuro Re-ed Details   working on postural correction; stretching lats and pecs - engaging posterior shoulder girdle musculature; middle and lower trap       Shoulder Exercises: Standing   Retraction  Strengthening;Both;15 reps;Theraband   isometric hold 2-3 sec engaging posterior shoulder girdle    Theraband Level (Shoulder Retraction)  Level 3 (Green)    Other Standing Exercises  scap squeeze chest up shoulder bladesdown and back x 10       Shoulder Exercises: ROM/Strengthening   UBE (Upper Arm Bike)  L2 x 4 min alt fwd/back       Shoulder Exercises: Stretch   Other Shoulder Stretches  3 way  doorway position stretch x  30 sec x 2 reps each position; overhead bilat shoulder flexion x 15 seconds.  Tricep stretch x 30 sec x 1 rep each arm, repeated x 15 seconds.     Other Shoulder Stretches  lat stretch supine with PT assist x 4 30-45 dsec hold; prolonged snow angel 2-3 min arms at ~ 80-90 deg; lying supine on the noodle 2-3 min; trunk rotation supine 45-60 ssec hold x 2 each side              PT Education - 04/14/18 0925    Education Details  HEP - education re neuromuscular re-ed; postural correction     Person(s) Educated  Patient    Methods  Explanation;Demonstration;Tactile cues;Verbal cues;Handout    Comprehension  Verbalized understanding;Returned demonstration;Verbal cues required;Tactile cues required          PT Long Term Goals - 04/07/18 0811      PT LONG TERM GOAL #1   Title  Improve posture and alignment with patient to demonstrate improved upright posture with posterior shoulder girdle engaged 05/04/18    Time  6    Period  Weeks    Status  On-going  PT LONG TERM GOAL #2   Title  Increased pain free shoulder ROM by 5-7 deg in shoulder elevation 05/04/18    Time  6    Period  Weeks    Status  On-going      PT LONG TERM GOAL #3   Title  Decrease pain by 75-100% allowing patient to return to normal funcitonal activities 05/04/18    Baseline  unchanged: 04/07/18    Time  6    Period  Weeks    Status  On-going      PT LONG TERM GOAL #4   Title  Independent in HEP 05/04/18     Time  6    Period  Weeks    Status  On-going      PT LONG TERM GOAL #5   Title  Improve FOTO to </= 23% limitation 05/04/18    Time  6    Period  Weeks    Status  On-going            Plan - 04/14/18 3875    Clinical Impression Statement  Patient reports continued pain with elevation of shoulders of overhead lifting with shoulders abducted. Patient has poor posture and alignment; muscular imbalance; abnormal movement patterns. He has tight lats/pecs/upper traps with  decresaed activation of posterior shoulder girdle musculature. Patient responded well to treatment consisting on neuromuscular re-education; stretching; posterior shoulder girdle activation.     Rehab Potential  Good    PT Frequency  2x / week    PT Duration  6 weeks    PT Treatment/Interventions  Patient/family education;ADLs/Self Care Home Management;Cryotherapy;Electrical Stimulation;Iontophoresis 4mg /ml Dexamethasone;Moist Heat;Ultrasound;Dry needling;Neuromuscular re-education;Functional mobility training;Therapeutic activities;Therapeutic exercise    PT Next Visit Plan  assess response to IASTM and sensitive skin Rock tape.  continue manual therapy .     Consulted and Agree with Plan of Care  Patient       Patient will benefit from skilled therapeutic intervention in order to improve the following deficits and impairments:  Postural dysfunction, Improper body mechanics, Pain, Increased fascial restricitons, Increased muscle spasms, Decreased mobility, Decreased range of motion, Decreased activity tolerance  Visit Diagnosis: Acute pain of left shoulder  Acute pain of right shoulder  Abnormal posture  Other symptoms and signs involving the musculoskeletal system     Problem List Patient Active Problem List   Diagnosis Date Noted  . Bilateral arm pain 03/16/2018  . Traumatic injury of subscapularis tendon 03/16/2018  . Well-controlled hypertension 03/16/2018  . Adult BMI 27.0-27.9 kg/sq m 09/24/2017  . Hypertension goal BP (blood pressure) < 130/80 06/09/2017  . Borderline hyperlipidemia 03/22/2016    Lataja Newland Rober Minion PT, MPH  04/14/2018, 9:38 AM  [facility] [address] [place], [place], [address] Phone: [phone]   Fax:  [phone]  Name: Winfred Swedenburg MRN: [id number] Date of Birth: 07/18/1975

## 2018-04-14 NOTE — Patient Instructions (Signed)
Lat stretch lying on back Pull knees to chest, hold Read book with hands - elbows stay in Push hands over head  60 sec hold 3-4 reps   Snow angel lying on back arms out to side at 80 to 90 deg working toward 120 deg  3-5 min can bend elbows to release stretch then return elbows to stretch position Can add pool noodle along spine   Trunk rotation - knees up  Hold 60 sec or longer 2-3 reps   Noodle squeeze standing - lift chest pulling shoulder blades down and back  Can reach for floor to stretch upper traps  Can tip ear to side to stretch upper trap

## 2018-04-22 ENCOUNTER — Encounter: Payer: BLUE CROSS/BLUE SHIELD | Admitting: Rehabilitative and Restorative Service Providers"

## 2018-04-23 ENCOUNTER — Encounter: Payer: BLUE CROSS/BLUE SHIELD | Admitting: Physical Therapy

## 2018-04-27 ENCOUNTER — Ambulatory Visit (INDEPENDENT_AMBULATORY_CARE_PROVIDER_SITE_OTHER): Payer: BLUE CROSS/BLUE SHIELD | Admitting: Rehabilitative and Restorative Service Providers"

## 2018-04-27 ENCOUNTER — Encounter: Payer: Self-pay | Admitting: Rehabilitative and Restorative Service Providers"

## 2018-04-27 DIAGNOSIS — R29898 Other symptoms and signs involving the musculoskeletal system: Secondary | ICD-10-CM

## 2018-04-27 DIAGNOSIS — R293 Abnormal posture: Secondary | ICD-10-CM | POA: Diagnosis not present

## 2018-04-27 DIAGNOSIS — M25512 Pain in left shoulder: Secondary | ICD-10-CM | POA: Diagnosis not present

## 2018-04-27 DIAGNOSIS — M25511 Pain in right shoulder: Secondary | ICD-10-CM | POA: Diagnosis not present

## 2018-04-27 NOTE — Therapy (Signed)
[facility] Outpatient Rehabilitation [place] [address] [place], [place], [address] Phone: [phone]   Fax:  [phone]  Physical Therapy Treatment  Patient Details  Name: Nathan Graham MRN: [id number] Date of Birth: 1975/06/27 Referring Provider (PT): Gena Fray PA-C   Encounter Date: 04/27/2018  PT End of Session - 04/27/18 0848    Visit Number  5    Number of Visits  12    Date for PT Re-Evaluation  05/04/18    PT Start Time  0841    PT Stop Time  0934    PT Time Calculation (min)  53 min    Activity Tolerance  Patient tolerated treatment well       Past Medical History:  Diagnosis Date  . Family history of lung cancer   . GERD (gastroesophageal reflux disease)   . Hyperlipidemia   . Hypertension   . Perianal abscess 05/28/2017    Past Surgical History:  Procedure Laterality Date  . NO PAST SURGERIES      There were no vitals filed for this visit.  Subjective Assessment - 04/27/18 0849    Subjective  Patient reports that the Rt shoulder may be some better - Lt is the same or maybe slightly worse. He has been consistent with HEP. He can't sleep on the Lt shoulder and he feels he is having nerve pain in the Lt UE.     Currently in Pain?  No/denies    Pain Score  4    no pain at rest - pain with movement    Pain Location  Shoulder    Pain Orientation  Left    Pain Descriptors / Indicators  Aching;Sharp    Pain Score  0   1 or 2/10 with certain movements    Pain Location  Shoulder    Pain Orientation  Right    Pain Descriptors / Indicators  Tightness    Pain Type  Acute pain         [facility] PT Assessment - 04/27/18 0001      Assessment   Medical Diagnosis  Bilat shoulder pain     Referring Provider (PT)  Gena Fray PA-C    Onset Date/Surgical Date  01/06/18    Hand Dominance  Left    Next MD Visit  04/09/18    Prior Therapy  none      AROM   Right Shoulder Extension  63 Degrees    Right Shoulder Flexion  157  Degrees    Right Shoulder ABduction  159 Degrees   tightness   Right Shoulder Internal Rotation  --   thumb to T8   Right Shoulder External Rotation  94 Degrees    Left Shoulder Extension  57 Degrees    Left Shoulder Flexion  159 Degrees    Left Shoulder ABduction  161 Degrees   tightness and mild pain    Left Shoulder Internal Rotation  --   thumb to T8   Left Shoulder External Rotation  96 Degrees                   [facility] Adult PT Treatment/Exercise - 04/27/18 0001      Shoulder Exercises: Standing   External Rotation  Strengthening;Both;10 reps;Theraband    Theraband Level (Shoulder External Rotation)  Level 3 (Green)    Extension  Strengthening;Both;10 reps;Theraband    Theraband Level (Shoulder Extension)  Level 3 (Green)    Row  Strengthening;Both;10 reps;Theraband    Theraband  Level (Shoulder Row)  Level 3 (Green);Level 4 (Blue)    Row Limitations  bw and arrow rowing green TB 15 reps bilat     Retraction  Strengthening;Both;15 reps;Theraband   isometric hold 2-3 sec engaging posterior shoulder girdle    Theraband Level (Shoulder Retraction)  Level 3 (Green)    Other Standing Exercises  scap squeeze chest up shoulder bladesdown and back x 10       Shoulder Exercises: ROM/Strengthening   UBE (Upper Arm Bike)  L2 x 5 min alt fwd/back       Shoulder Exercises: Stretch   Other Shoulder Stretches  3 way doorway position stretch x  30 sec x 2 reps each position; overhead bilat shoulder flexion x 15 seconds.  Tricep stretch x 30 sec x 1 rep each arm, repeated x 15 seconds.     Other Shoulder Stretches  lat stretch standing arms on surface flexiing trunk forward 3 reps 30-45 dsec hold; prolonged snow angel 2-3 min arms at ~ 80-90 deg; lying supine on the noodle 2-3 min; trunk rotation supine 45-60 ssec hold x 2 each side       Manual Therapy   Manual therapy comments  pt supine     Soft tissue mobilization  Lt > Rt pecs anterior shoulder deltoid biceps         Trigger Point Dry Needling - 04/27/18 0932    Consent Given?  Yes    Education Handout Provided  Yes    Muscles Treated Upper Body  Pectoralis major;Pectoralis minor   biceps Lt    Pectoralis Major Response  Palpable increased muscle length    Pectoralis Minor Response  Palpable increased muscle length;Twitch response elicited           PT Education - 04/27/18 0918    Education Details  HEP DN     Person(s) Educated  Patient    Methods  Explanation;Demonstration;Tactile cues;Verbal cues;Handout    Comprehension  Verbalized understanding;Returned demonstration;Verbal cues required;Tactile cues required          PT Long Term Goals - 04/07/18 0811      PT LONG TERM GOAL #1   Title  Improve posture and alignment with patient to demonstrate improved upright posture with posterior shoulder girdle engaged 05/04/18    Time  6    Period  Weeks    Status  On-going      PT LONG TERM GOAL #2   Title  Increased pain free shoulder ROM by 5-7 deg in shoulder elevation 05/04/18    Time  6    Period  Weeks    Status  On-going      PT LONG TERM GOAL #3   Title  Decrease pain by 75-100% allowing patient to return to normal funcitonal activities 05/04/18    Baseline  unchanged: 04/07/18    Time  6    Period  Weeks    Status  On-going      PT LONG TERM GOAL #4   Title  Independent in HEP 05/04/18     Time  6    Period  Weeks    Status  On-going      PT LONG TERM GOAL #5   Title  Improve FOTO to </= 23% limitation 05/04/18    Time  6    Period  Weeks    Status  On-going            Plan - 04/27/18 0849    Clinical  Impression Statement  Patient reports some progress with Rt shoulder but Lt shoudler continues to be painful with lifting and overhead activities. He does "some" exercises every day but not as directed. REviewed exercises and offered suggestions for fitting exercises into the day. Added exercises to address tightness in the lats and posterior shoulder girdle  strengthening. Trial of DN to Lt pecs; anterior deltoid; biceps which pt tolerated well. He demonstrates good gains in bilat shoulder ROM and pain is decreasing per pt report. He is frustrated with slow progress. Sees MD next week.     Rehab Potential  Good    PT Frequency  2x / week    PT Duration  6 weeks    PT Treatment/Interventions  Patient/family education;ADLs/Self Care Home Management;Cryotherapy;Electrical Stimulation;Iontophoresis 4mg /ml Dexamethasone;Moist Heat;Ultrasound;Dry needling;Neuromuscular re-education;Functional mobility training;Therapeutic activities;Therapeutic exercise    PT Next Visit Plan  assess response to manual work and DN.  continue manual therapy, stretching; posterior shoulder girdle strengthening     Consulted and Agree with Plan of Care  Patient       Patient will benefit from skilled therapeutic intervention in order to improve the following deficits and impairments:  Postural dysfunction, Improper body mechanics, Pain, Increased fascial restricitons, Increased muscle spasms, Decreased mobility, Decreased range of motion, Decreased activity tolerance  Visit Diagnosis: Acute pain of left shoulder  Acute pain of right shoulder  Abnormal posture  Other symptoms and signs involving the musculoskeletal system     Problem List Patient Active Problem List   Diagnosis Date Noted  . Bilateral arm pain 03/16/2018  . Traumatic injury of subscapularis tendon 03/16/2018  . Well-controlled hypertension 03/16/2018  . Adult BMI 27.0-27.9 kg/sq m 09/24/2017  . Hypertension goal BP (blood pressure) < 130/80 06/09/2017  . Borderline hyperlipidemia 03/22/2016    Yuri Flener Rober Minion PT, MPH  04/27/2018, 9:47 AM  [facility] [address] [place], [place], [address] Phone: [phone]   Fax:  [phone]  Name: Jeffrie Dicola MRN: [id number] Date of Birth: 06/26/1975

## 2018-04-27 NOTE — Patient Instructions (Addendum)
Flexors Stretch, Standing    Stand about a thigh's length from table. Grip edges of table. Bend knees until stretch is felt under shoulder blades in chest. Hold _45-60__ seconds. Repeat _2-3__ times per session. Do _2-3__ sessions per day.   Lat Stretch, Standing    Stand and place palms against wall, shoulder-width apart. Lean upper body forward and push lower back outward. Hold _45-60__ seconds. Repeat _2-3__ times per session. Do _2-3_ sessions per day.   Bow and arrow stretch - 15-20 reps slow return  Step back with pull   Trigger Point Dry Needling  . What is Trigger Point Dry Needling (DN)? o DN is a physical therapy technique used to treat muscle pain and dysfunction. Specifically, DN helps deactivate muscle trigger points (muscle knots).  o A thin filiform needle is used to penetrate the skin and stimulate the underlying trigger point. The goal is for a local twitch response (LTR) to occur and for the trigger point to relax. No medication of any kind is injected during the procedure.   . What Does Trigger Point Dry Needling Feel Like?  o The procedure feels different for each individual patient. Some patients report that they do not actually feel the needle enter the skin and overall the process is not painful. Very mild bleeding may occur. However, many patients feel a deep cramping in the muscle in which the needle was inserted. This is the local twitch response.   Marland Kitchen How Will I feel after the treatment? o Soreness is normal, and the onset of soreness may not occur for a few hours. Typically this soreness does not last longer than two days.  o Bruising is uncommon, however; ice can be used to decrease any possible bruising.  o In rare cases feeling tired or nauseous after the treatment is normal. In addition, your symptoms may get worse before they get better, this period will typically not last longer than 24 hours.   . What Can I do After My Treatment? o Increase your  hydration by drinking more water for the next 24 hours. o You may place ice or heat on the areas treated that have become sore, however, do not use heat on inflamed or bruised areas. Heat often brings more relief post needling. o You can continue your regular activities, but vigorous activity is not recommended initially after the treatment for 24 hours. o DN is best combined with other physical therapy such as strengthening, stretching, and other therapies.

## 2018-04-29 ENCOUNTER — Encounter: Payer: BLUE CROSS/BLUE SHIELD | Admitting: Rehabilitative and Restorative Service Providers"

## 2018-05-06 ENCOUNTER — Ambulatory Visit (INDEPENDENT_AMBULATORY_CARE_PROVIDER_SITE_OTHER): Payer: BLUE CROSS/BLUE SHIELD | Admitting: Physical Therapy

## 2018-05-06 ENCOUNTER — Encounter: Payer: Self-pay | Admitting: Physical Therapy

## 2018-05-06 DIAGNOSIS — R29898 Other symptoms and signs involving the musculoskeletal system: Secondary | ICD-10-CM

## 2018-05-06 DIAGNOSIS — M25512 Pain in left shoulder: Secondary | ICD-10-CM | POA: Diagnosis not present

## 2018-05-06 DIAGNOSIS — M25511 Pain in right shoulder: Secondary | ICD-10-CM | POA: Diagnosis not present

## 2018-05-06 DIAGNOSIS — R293 Abnormal posture: Secondary | ICD-10-CM

## 2018-05-06 NOTE — Therapy (Addendum)
[place], [place], [address] Phone: [phone]   Fax:  [phone]  Physical Therapy Treatment  Patient Details  Name: Nathan Graham MRN: [id number] Date of Birth: April 22, 1975 Referring Provider (PT): Nelson Chimes PA-C   Encounter Date: 05/06/2018  PT End of Session - 05/06/18 0808    Visit Number  6    Number of Visits  12    PT Start Time  0804    PT Stop Time  0847    PT Time Calculation (min)  43 min    Activity Tolerance  Patient tolerated treatment well    Behavior During Therapy  [facility] for tasks assessed/performed       Past Medical History:  Diagnosis Date  . Family history of lung cancer   . GERD (gastroesophageal reflux disease)   . Hyperlipidemia   . Hypertension   . Perianal abscess 05/28/2017    Past Surgical History:  Procedure Laterality Date  . NO PAST SURGERIES      There were no vitals filed for this visit.  Subjective Assessment - 05/06/18 0810    Subjective  Nathan Graham reports he had relief with the DN last session "for a while" - "it wasn't 100%, but it was better".  But the pain returned in shoulders.  He continues to have difficulty with abduction bilat overhead and difficulty sleeping on Lt side.     Patient Stated Goals  get back in the gym lifting weights - lift without hurting     Currently in Pain?  Yes    Pain Score  4     Pain Location  Shoulder    Pain Orientation  Left    Pain Descriptors / Indicators  Aching    Aggravating Factors   lifting over 90 deg    Pain Relieving Factors  DN, stretches    Pain Score  2    Pain Location  Shoulder    Pain Orientation  Right    Pain Descriptors / Indicators  Aching;Tightness    Aggravating Factors   same as RUE.         [facility] PT Assessment - 05/06/18 0001      Assessment   Medical Diagnosis  Bilat shoulder pain     Referring Provider (PT)  Nelson Chimes PA-C    Onset Date/Surgical Date  01/06/18    Hand  Dominance  Left    Next MD Visit  05/07/18    Prior Therapy  none      Observation/Other Assessments   Focus on Therapeutic Outcomes (FOTO)   28% limited (23% goal)      AROM   Right Shoulder Extension  65 Degrees    Right Shoulder Flexion  159 Degrees    Right Shoulder Internal Rotation  --   thumb to T8   Right Shoulder External Rotation  100 Degrees    Left Shoulder Extension  60 Degrees    Left Shoulder Flexion  156 Degrees    Left Shoulder Internal Rotation  --   thumb to T8   Left Shoulder External Rotation  96 Degrees       [facility] Adult PT Treatment/Exercise - 05/06/18 0001      Shoulder Exercises: Prone   Flexion  Strengthening;Right;Left;10 reps;Weights    Flexion Weight (lbs)  2    Horizontal ABduction 1  Strengthening;Both;10 reps;Weights    Horizontal ABduction 1 Weight (lbs)  2       Shoulder Exercises:  Standing   External Rotation  Strengthening;Both;10 reps;Theraband    Theraband Level (Shoulder External Rotation)  Level 4 (Blue)    Extension  Strengthening;Both;5 reps;Theraband    Theraband Level (Shoulder Extension)  Level 4 (Blue)    Row  Strengthening;Both;10 reps;Theraband    Theraband Level (Shoulder Row)  Level 4 (Blue)   5 sec pause in retraction     Shoulder Exercises: ROM/Strengthening   Nustep  L5: arms only x 5 min       Shoulder Exercises: Stretch   Other Shoulder Stretches  3 way doorway position stretch x  30 sec x 2 reps each position; overhead bilat shoulder flexion x 15 second x 3.  Tricep stretch x 30 sec x 3 rep each arm; bicep stretch holding door x 30 sec x 2 reps each arm.       Manual Therapy   Soft tissue mobilization  IASTM with Edge tool to bilat ant/mid/lposterior Rt/Lt shoulder (including distal pec) to decrease fascial tightness and pain        PT Long Term Goals - 05/06/18 0817      PT LONG TERM GOAL #1   Title  Improve posture and alignment with patient to demonstrate improved upright posture with posterior shoulder  girdle engaged 05/04/18    Time  6    Period  Weeks    Status  Partially Met      PT LONG TERM GOAL #2   Title  Increased pain free shoulder ROM by 5-7 deg in shoulder elevation 05/04/18    Time  6    Period  Weeks    Status  Partially Met      PT LONG TERM GOAL #3   Title  Decrease pain by 75-100% allowing patient to return to normal funcitonal activities 05/04/18    Baseline  Rt- 80% improvement; Lt - 40% improvement    Time  6    Period  Weeks    Status  Partially Met      PT LONG TERM GOAL #4   Title  Independent in HEP 05/04/18     Time  6    Period  Weeks    Status  Achieved      PT LONG TERM GOAL #5   Title  Improve FOTO to </= 23% limitation 05/04/18    Time  6    Period  Weeks    Status  On-going            Plan - 05/06/18 1329    Clinical Impression Statement  Pt has made a great deal of progress since initiating therapy in mid-December (with 6 total visits), with improved posture and shoulder ROM.  However, shoulder pain bilat remains persistant with shoulder ABD greater than 90 deg. Pt tolerated all exercises well, without increase in symptoms.  Exercises progressed with increased resistance for postural strengthening.  Pt has partially met his goals and verbalized desire to hold therapy for upcoming MD appt.      Rehab Potential  Good    PT Frequency  2x / week    PT Duration  6 weeks    PT Treatment/Interventions  Patient/family education;ADLs/Self Care Home Management;Cryotherapy;Electrical Stimulation;Iontophoresis 68m/ml Dexamethasone;Moist Heat;Ultrasound;Dry needling;Neuromuscular re-education;Functional mobility training;Therapeutic activities;Therapeutic exercise    PT Next Visit Plan  will hold therapy per pt request and await further advisement from MD.     Consulted and Agree with Plan of Care  Patient       Patient will benefit  from skilled therapeutic intervention in order to improve the following deficits and impairments:  Postural dysfunction,  Improper body mechanics, Pain, Increased fascial restricitons, Increased muscle spasms, Decreased mobility, Decreased range of motion, Decreased activity tolerance  Visit Diagnosis: Acute pain of left shoulder  Acute pain of right shoulder  Abnormal posture  Other symptoms and signs involving the musculoskeletal system     Problem List Patient Active Problem List   Diagnosis Date Noted  . Bilateral arm pain 03/16/2018  . Traumatic injury of subscapularis tendon 03/16/2018  . Well-controlled hypertension 03/16/2018  . Adult BMI 27.0-27.9 kg/sq m 09/24/2017  . Hypertension goal BP (blood pressure) < 130/80 06/09/2017  . Borderline hyperlipidemia 03/22/2016   Kerin Perna, PTA 05/06/18 1:36 PM  [facility] Out[facility] [place], [place], [address] Phone: [phone]   Fax:  [phone]  Name: Nathan Graham MRN: [id number] Date of Birth: 1975-04-23   PHYSICAL THERAPY DISCHARGE SUMMARY  Visits from Start of Care: 6  Current functional level related to goals / functional outcomes: See last progress note for discharge status    Remaining deficits: Unknown    Education / Equipment: HEP  Plan: Patient agrees to discharge.  Patient goals were partially met. Patient is being discharged due to being pleased with the current functional level.  ?????     Celyn P. Helene Kelp PT, MPH 06/10/18 12:36 PM

## 2018-05-07 ENCOUNTER — Encounter: Payer: Self-pay | Admitting: Sports Medicine

## 2018-05-07 ENCOUNTER — Ambulatory Visit (INDEPENDENT_AMBULATORY_CARE_PROVIDER_SITE_OTHER): Payer: BLUE CROSS/BLUE SHIELD | Admitting: Sports Medicine

## 2018-05-07 DIAGNOSIS — M7541 Impingement syndrome of right shoulder: Secondary | ICD-10-CM | POA: Diagnosis not present

## 2018-05-07 DIAGNOSIS — M7542 Impingement syndrome of left shoulder: Secondary | ICD-10-CM

## 2018-05-07 NOTE — Assessment & Plan Note (Signed)
Improved considerably with activity modification, formal PT. He can use NSAIDs as needed, I have asked him to avoid overhead activities in the gym such as overhead press, incline bench. He will use other exercises to work out his pecs and deltoids. He will also decrease his weight and conversely increase his reps to the point of muscle fatigue. He can return to see me as needed, next step would be bilateral subacromial injections.

## 2018-05-07 NOTE — Progress Notes (Signed)
Subjective:    I'm seeing this patient as a consultation for: Gena Fray, PA-C  CC: Bilateral shoulder pain  HPI: This is a pleasant 43 year old male, for the past few months he said increasing pain over his deltoids bilaterally.  Moderate, persistent without radiation, worse with overhead activities.  He was treated appropriately by his PCP with physical therapy, activity modification.  He has improved considerably.  At this point his pain is only minimal and he is eager to get back into the gym.  I reviewed the past medical history, family history, social history, surgical history, and allergies today and no changes were needed.  Please see the problem list section below in epic for further details.  Past Medical History: Past Medical History:  Diagnosis Date  . Family history of lung cancer   . GERD (gastroesophageal reflux disease)   . Hyperlipidemia   . Hypertension   . Perianal abscess 05/28/2017   Past Surgical History: Past Surgical History:  Procedure Laterality Date  . NO PAST SURGERIES     Social History: Social History   Socioeconomic History  . Marital status: Married    Spouse name: Not on file  . Number of children: Not on file  . Years of education: Not on file  . Highest education level: Not on file  Occupational History  . Not on file  Social Needs  . Financial resource strain: Not on file  . Food insecurity:    Worry: Not on file    Inability: Not on file  . Transportation needs:    Medical: Not on file    Non-medical: Not on file  Tobacco Use  . Smoking status: Never Smoker  . Smokeless tobacco: Never Used  Substance and Sexual Activity  . Alcohol use: Not Currently  . Drug use: Never  . Sexual activity: Yes    Birth control/protection: None  Lifestyle  . Physical activity:    Days per week: Not on file    Minutes per session: Not on file  . Stress: Not on file  Relationships  . Social connections:    Talks on phone: Not on file      Gets together: Not on file    Attends religious service: Not on file    Active member of club or organization: Not on file    Attends meetings of clubs or organizations: Not on file    Relationship status: Not on file  Other Topics Concern  . Not on file  Social History Narrative  . Not on file   Family History: Family History  Problem Relation Age of Onset  . Hypertension Mother   . Hypothyroidism Mother   . Hypertension Father   . Hyperlipidemia Father   . Stroke Father   . Pancreatic cancer Father   . Pancreatic cancer Maternal Grandmother   . Basal cell carcinoma Brother   . Colon cancer Maternal Aunt   . Lung cancer Maternal Aunt    Allergies: No Known Allergies Medications: See med rec.  Review of Systems: No headache, visual changes, nausea, vomiting, diarrhea, constipation, dizziness, abdominal pain, skin rash, fevers, chills, night sweats, weight loss, swollen lymph nodes, body aches, joint swelling, muscle aches, chest pain, shortness of breath, mood changes, visual or auditory hallucinations.   Objective:   General: Well Developed, well nourished, and in no acute distress.  Neuro:  Extra-ocular muscles intact, able to move all 4 extremities, sensation grossly intact.  Deep tendon reflexes tested were normal. Psych: Alert  and oriented, mood congruent with affect. ENT:  Ears and nose appear unremarkable.  Hearing grossly normal. Neck: Unremarkable overall appearance, trachea midline.  No visible thyroid enlargement. Eyes: Conjunctivae and lids appear unremarkable.  Pupils equal and round. Skin: Warm and dry, no rashes noted.  Cardiovascular: Pulses palpable, no extremity edema. Bilateral shoulders: Inspection reveals no abnormalities, atrophy or asymmetry. Palpation is normal with no tenderness over AC joint or bicipital groove. ROM is full in all planes. Rotator cuff strength overall normal, slightly weak abduction. No signs of impingement with negative Neer  and Hawkin's tests, empty can. Speeds and Yergason's tests normal. No labral pathology noted with negative Obrien's, negative crank, negative clunk, and good stability. Normal scapular function observed. No painful arc and no drop arm sign. No apprehension sign  Impression and Recommendations:   This case required medical decision making of moderate complexity.  Impingement syndrome of both shoulders Improved considerably with activity modification, formal PT. He can use NSAIDs as needed, I have asked him to avoid overhead activities in the gym such as overhead press, incline bench. He will use other exercises to work out his pecs and deltoids. He will also decrease his weight and conversely increase his reps to the point of muscle fatigue. He can return to see me as needed, next step would be bilateral subacromial injections. ___________________________________________ Nathan Graham. Nathan Graham, M.D., ABFM., CAQSM. Primary Care and Sports Medicine [facility]  Adjunct Professor of Family Medicine  [employer]

## 2018-10-08 ENCOUNTER — Encounter: Payer: Self-pay | Admitting: Physician Assistant

## 2018-10-08 ENCOUNTER — Ambulatory Visit (INDEPENDENT_AMBULATORY_CARE_PROVIDER_SITE_OTHER): Payer: BC Managed Care – PPO | Admitting: Physician Assistant

## 2018-10-08 VITALS — BP 119/75 | HR 67 | Temp 98.1°F | Wt 221.0 lb

## 2018-10-08 DIAGNOSIS — Z0000 Encounter for general adult medical examination without abnormal findings: Secondary | ICD-10-CM | POA: Diagnosis not present

## 2018-10-08 DIAGNOSIS — Z13220 Encounter for screening for lipoid disorders: Secondary | ICD-10-CM | POA: Diagnosis not present

## 2018-10-08 DIAGNOSIS — Z6828 Body mass index (BMI) 28.0-28.9, adult: Secondary | ICD-10-CM

## 2018-10-08 DIAGNOSIS — Z130 Encounter for screening for diseases of the blood and blood-forming organs and certain disorders involving the immune mechanism: Secondary | ICD-10-CM | POA: Diagnosis not present

## 2018-10-08 DIAGNOSIS — Z0289 Encounter for other administrative examinations: Secondary | ICD-10-CM

## 2018-10-08 DIAGNOSIS — Z131 Encounter for screening for diabetes mellitus: Secondary | ICD-10-CM

## 2018-10-08 NOTE — Patient Instructions (Signed)
Preventive Care 42-42 Years Old, Male Preventive care refers to lifestyle choices and visits with your health care provider that can promote health and wellness. This includes:  A yearly physical exam. This is also called an annual well check.  Regular dental and eye exams.  Immunizations.  Screening for certain conditions.  Healthy lifestyle choices, such as eating a healthy diet, getting regular exercise, not using drugs or products that contain nicotine and tobacco, and limiting alcohol use. What can I expect for my preventive care visit? Physical exam Your health care provider will check:  Height and weight. These may be used to calculate body mass index (BMI), which is a measurement that tells if you are at a healthy weight.  Heart rate and blood pressure.  Your skin for abnormal spots. Counseling Your health care provider may ask you questions about:  Alcohol, tobacco, and drug use.  Emotional well-being.  Home and relationship well-being.  Sexual activity.  Eating habits.  Work and work environment. What immunizations do I need?  Influenza (flu) vaccine  This is recommended every year. Tetanus, diphtheria, and pertussis (Tdap) vaccine  You may need a Td booster every 10 years. Varicella (chickenpox) vaccine  You may need this vaccine if you have not already been vaccinated. Zoster (shingles) vaccine  You may need this after age 60. Measles, mumps, and rubella (MMR) vaccine  You may need at least one dose of MMR if you were born in 1957 or later. You may also need a second dose. Pneumococcal conjugate (PCV13) vaccine  You may need this if you have certain conditions and were not previously vaccinated. Pneumococcal polysaccharide (PPSV23) vaccine  You may need one or two doses if you smoke cigarettes or if you have certain conditions. Meningococcal conjugate (MenACWY) vaccine  You may need this if you have certain conditions. Hepatitis A vaccine   You may need this if you have certain conditions or if you travel or work in places where you may be exposed to hepatitis A. Hepatitis B vaccine  You may need this if you have certain conditions or if you travel or work in places where you may be exposed to hepatitis B. Haemophilus influenzae type b (Hib) vaccine  You may need this if you have certain risk factors. Human papillomavirus (HPV) vaccine  If recommended by your health care provider, you may need three doses over 6 months. You may receive vaccines as individual doses or as more than one vaccine together in one shot (combination vaccines). Talk with your health care provider about the risks and benefits of combination vaccines. What tests do I need? Blood tests  Lipid and cholesterol levels. These may be checked every 5 years, or more frequently if you are over 50 years old.  Hepatitis C test.  Hepatitis B test. Screening  Lung cancer screening. You may have this screening every year starting at age 42 if you have a 30-pack-year history of smoking and currently smoke or have quit within the past 15 years.  Prostate cancer screening. Recommendations will vary depending on your family history and other risks.  Colorectal cancer screening. All adults should have this screening starting at age 50 and continuing until age 42. Your health care provider may recommend screening at age 42 if you are at increased risk. You will have tests every 1-10 years, depending on your results and the type of screening test.  Diabetes screening. This is done by checking your blood sugar (glucose) after you have not eaten   for a while (fasting). You may have this done every 1-3 years.  Sexually transmitted disease (STD) testing. Follow these instructions at home: Eating and drinking  Eat a diet that includes fresh fruits and vegetables, whole grains, lean protein, and low-fat dairy products.  Take vitamin and mineral supplements as recommended  by your health care provider.  Do not drink alcohol if your health care provider tells you not to drink.  If you drink alcohol: ? Limit how much you have to 0-2 drinks a day. ? Be aware of how much alcohol is in your drink. In the [place] one drink equals one 12 oz bottle of beer (355 mL), one 5 oz glass of wine (148 mL), or one 1 oz glass of hard liquor (44 mL). Lifestyle  Take daily care of your teeth and gums.  Stay active. Exercise for at least 30 minutes on 5 or more days each week.  Do not use any products that contain nicotine or tobacco, such as cigarettes, e-cigarettes, and chewing tobacco. If you need help quitting, ask your health care provider.  If you are sexually active, practice safe sex. Use a condom or other form of protection to prevent STIs (sexually transmitted infections).  Talk with your health care provider about taking a low-dose aspirin every day starting at age 42. What's next?  Go to your health care provider once a year for a well check visit.  Ask your health care provider how often you should have your eyes and teeth checked.  Stay up to date on all vaccines. This information is not intended to replace advice given to you by your health care provider. Make sure you discuss any questions you have with your health care provider. Document Released: 04/21/2015 Document Revised: 03/19/2018 Document Reviewed: 03/19/2018 Elsevier Patient Education  2020 [employer].

## 2018-10-08 NOTE — Progress Notes (Signed)
HPI:                                                                Nathan Graham is a 43 y.o. male [name] today for annual physical exam  Current Concerns include: requesting form completion for participation in [employer]  Health Maintenance Health Maintenance  Topic Date Due  . HIV Screening  12/18/1991  . INFLUENZA VACCINE  11/07/2018  . TETANUS/TDAP  03/22/2026   Depression screen PHQ 2/9 10/08/2018 03/12/2018 06/02/2017  Decreased Interest 0 0 0  Down, Depressed, Hopeless 0 0 0  PHQ - 2 Score 0 0 0   Fall Risk  10/08/2018  Falls in the past year? 0     Health Habits  Diet: regular  Exercise: 6 days per week, boxing/weights/aerobic training   Past Medical History:  Diagnosis Date  . Family history of lung cancer   . GERD (gastroesophageal reflux disease)   . Hyperlipidemia   . Hypertension   . Perianal abscess 05/28/2017   Past Surgical History:  Procedure Laterality Date  . NO PAST SURGERIES     Social History   Tobacco Use  . Smoking status: Never Smoker  . Smokeless tobacco: Never Used  Substance Use Topics  . Alcohol use: Not Currently   family history includes Basal cell carcinoma in his brother; Colon cancer in his maternal aunt; Hyperlipidemia in his father; Hypertension in his father and mother; Hypothyroidism in his mother; Lung cancer in his maternal aunt; Pancreatic cancer in his father and maternal grandmother; Stroke in his father.  ROS: negative except as noted in the HPI  Medications: Current Outpatient Medications  Medication Sig Dispense Refill  . Amino Acids (AMINO ACID PROTEIN PO) Take by mouth.    Marland Kitchen. GLUTAMINE PO Take by mouth.    . Multiple Vitamin (MULTIVITAMIN WITH MINERALS) TABS tablet Take 1 tablet by mouth daily.     No current facility-administered medications for this visit.    No Known Allergies     Objective:  BP 119/75   Pulse 67   Temp 98.1 F  (36.7 C)   Wt 221 lb (100.2 kg)   BMI 28.37 kg/m  General Appearance:  Alert, cooperative, no distress, appropriate for age                            Head:  Normocephalic, without obvious abnormality                             Eyes:  PERRL, EOM's intact, conjunctiva and cornea clear, wearing contact lenses                             Ears:  TM pearly gray color and semitransparent, external ear canals normal, both ears                            Nose:  Nares symmetrical, mucosa pink  Throat:  Lips, tongue, and mucosa are moist, pink, and intact; oropharynx clear, uvula midline; good dentition                             Neck:  Supple; symmetrical, trachea midline, no adenopathy; thyroid: no enlargement, symmetric, no tenderness/mass/nodules                             Back:  Symmetrical, no abnormal curvature, ROM normal                                     Lungs:  Clear to auscultation bilaterally, respirations unlabored                             Heart:  normal rate & regular rhythm, S1 and S2 normal, no murmurs, rubs, or gallops                     Abdomen:  Soft, non-tender, no mass or organomegaly              Genitourinary:  deferred         Musculoskeletal:  Tone and strength strong and symmetrical, all extremities; no joint pain or edema, normal gait and station, distal pulses intact, no calf tenderness                                     Lymphatic:  No adenopathy             Skin/Hair/Nails:  Skin warm, dry and intact, no rashes or abnormal dyspigmentation on limited exam                   Neurologic:  Alert and oriented x3, no cranial nerve deficits, sensation grossly intact, normal gait and station, no tremor Psych: well-groomed, cooperative, good eye contact, euthymic mood, affect mood-congruent, speech is articulate, and thought processes clear and goal-directed  BP Readings from Last 3 Encounters:  10/08/18 119/75  05/07/18 103/68  03/12/18 115/82    Pulse Readings from Last 3 Encounters:  10/08/18 67  05/07/18 67  03/12/18 60   Wt Readings from Last 3 Encounters:  10/08/18 221 lb (100.2 kg)  05/07/18 228 lb (103.4 kg)  03/12/18 223 lb (101.2 kg)   Lab Results  Component Value Date   CREATININE 1.25 09/24/2017   BUN 23 09/24/2017   NA 138 09/24/2017   K 5.1 09/24/2017   CL 105 09/24/2017   CO2 26 09/24/2017   No results found for: ALT, AST, GGT, ALKPHOS, BILITOT No results found for: HGBA1C No results found for: WBC, HGB, HCT, MCV, PLT Lab Results  Component Value Date   CHOL 203 (H) 09/24/2017   HDL 46 09/24/2017   LDLCALC 137 (H) 09/24/2017   TRIG 98 09/24/2017   CHOLHDL 4.4 09/24/2017   The 10-year ASCVD risk score Mikey Bussing DC Jr., et al., 2013) is: 1.3%   Values used to calculate the score:     Age: 66 years     Sex: Male     Is Non-Hispanic African American: No     Diabetic: No     Tobacco smoker: No  Systolic Blood Pressure: 119 mmHg     Is BP treated: No     HDL Cholesterol: 46 mg/dL     Total Cholesterol: 203 mg/dL   Assessment and Plan: 43 y.o. male with   .Diagnoses and all orders for this visit:  Encounter for annual physical exam -     COMPLETE METABOLIC PANEL WITH GFR -     Lipid Panel w/reflex Direct LDL -     CBC  Screening for lipid disorders -     Lipid Panel w/reflex Direct LDL  Screening for diabetes mellitus -     COMPLETE METABOLIC PANEL WITH GFR  Screening for blood disease -     COMPLETE METABOLIC PANEL WITH GFR -     CBC  Adult BMI 28.0-28.9 kg/sq m -     COMPLETE METABOLIC PANEL WITH GFR -     Lipid Panel w/reflex Direct LDL -     CBC   - Personally reviewed PMH, PSH, PFH, medications, allergies, HM - Age-appropriate cancer screening: n/a - Tdap UTD - PHQ2 negative - Fall screen negative - Form completed for participation without restrictions, signed and returned to patient today  Patient education and anticipatory guidance given Patient agrees with  treatment plan Follow-up in 1 year for CPE with fasting labs or sooner as needed  Levonne Hubertharley E. Vondra Aldredge PA-C

## 2018-10-30 DIAGNOSIS — Z130 Encounter for screening for diseases of the blood and blood-forming organs and certain disorders involving the immune mechanism: Secondary | ICD-10-CM | POA: Diagnosis not present

## 2018-10-30 DIAGNOSIS — Z131 Encounter for screening for diabetes mellitus: Secondary | ICD-10-CM | POA: Diagnosis not present

## 2018-10-30 DIAGNOSIS — Z0000 Encounter for general adult medical examination without abnormal findings: Secondary | ICD-10-CM | POA: Diagnosis not present

## 2018-10-30 DIAGNOSIS — Z13220 Encounter for screening for lipoid disorders: Secondary | ICD-10-CM | POA: Diagnosis not present

## 2018-10-31 LAB — LIPID PANEL W/REFLEX DIRECT LDL
Cholesterol: 198 mg/dL (ref <200)
HDL: 52 mg/dL (ref >=40)
LDL Cholesterol (Calc): 129 mg/dL (calc) — ABNORMAL HIGH
Non-HDL Cholesterol (Calc): 146 mg/dL (calc) — ABNORMAL HIGH (ref <130)
Total CHOL/HDL Ratio: 3.8 (calc) (ref <5.0)
Triglycerides: 73 mg/dL (ref <150)

## 2018-10-31 LAB — CBC
HCT: 45.5 % (ref 38.5–50.0)
Hemoglobin: 15.1 g/dL (ref 13.2–17.1)
MCH: 31.2 pg (ref 27.0–33.0)
MCHC: 33.2 g/dL (ref 32.0–36.0)
MCV: 94 fL (ref 80.0–100.0)
MPV: 12.2 fL (ref 7.5–12.5)
Platelets: 199 10*3/uL (ref 140–400)
RBC: 4.84 10*6/uL (ref 4.20–5.80)
RDW: 12.9 % (ref 11.0–15.0)
WBC: 4 10*3/uL (ref 3.8–10.8)

## 2018-10-31 LAB — COMPLETE METABOLIC PANEL WITH GFR
AG Ratio: 1.9 (calc) (ref 1.0–2.5)
ALT: 23 U/L (ref 9–46)
AST: 26 U/L (ref 10–40)
Albumin: 4.7 g/dL (ref 3.6–5.1)
Alkaline phosphatase (APISO): 46 U/L (ref 36–130)
BUN: 21 mg/dL (ref 7–25)
CO2: 28 mmol/L (ref 20–32)
Calcium: 9.8 mg/dL (ref 8.6–10.3)
Chloride: 104 mmol/L (ref 98–110)
Creat: 1.14 mg/dL (ref 0.60–1.35)
GFR, Est African American: 92 mL/min/{1.73_m2} (ref >=60)
GFR, Est Non African American: 79 mL/min/{1.73_m2} (ref >=60)
Globulin: 2.5 g/dL (calc) (ref 1.9–3.7)
Glucose, Bld: 92 mg/dL (ref 65–99)
Potassium: 4.7 mmol/L (ref 3.5–5.3)
Sodium: 138 mmol/L (ref 135–146)
Total Bilirubin: 0.7 mg/dL (ref 0.2–1.2)
Total Protein: 7.2 g/dL (ref 6.1–8.1)

## 2019-01-23 DIAGNOSIS — Z23 Encounter for immunization: Secondary | ICD-10-CM | POA: Diagnosis not present

## 2019-08-17 ENCOUNTER — Other Ambulatory Visit: Payer: Self-pay

## 2019-08-17 ENCOUNTER — Encounter: Payer: Self-pay | Admitting: Family Medicine

## 2019-08-17 ENCOUNTER — Ambulatory Visit (INDEPENDENT_AMBULATORY_CARE_PROVIDER_SITE_OTHER): Payer: BC Managed Care – PPO | Admitting: Family Medicine

## 2019-08-17 VITALS — BP 118/64 | HR 54 | Temp 98.2°F | Ht 74.02 in | Wt 193.7 lb

## 2019-08-17 DIAGNOSIS — Z13220 Encounter for screening for lipoid disorders: Secondary | ICD-10-CM

## 2019-08-17 DIAGNOSIS — Z0000 Encounter for general adult medical examination without abnormal findings: Secondary | ICD-10-CM | POA: Diagnosis not present

## 2019-08-17 LAB — CBC
HCT: 45.4 % (ref 38.5–50.0)
Hemoglobin: 14.7 g/dL (ref 13.2–17.1)
MCH: 30.9 pg (ref 27.0–33.0)
MCHC: 32.4 g/dL (ref 32.0–36.0)
MCV: 95.6 fL (ref 80.0–100.0)
MPV: 11.1 fL (ref 7.5–12.5)
Platelets: 218 10*3/uL (ref 140–400)
RBC: 4.75 10*6/uL (ref 4.20–5.80)
RDW: 12.5 % (ref 11.0–15.0)
WBC: 3.6 10*3/uL — ABNORMAL LOW (ref 3.8–10.8)

## 2019-08-17 LAB — LIPID PANEL
Cholesterol: 242 mg/dL — ABNORMAL HIGH (ref <200)
HDL: 57 mg/dL (ref >=40)
LDL Cholesterol (Calc): 169 mg/dL (calc) — ABNORMAL HIGH
Non-HDL Cholesterol (Calc): 185 mg/dL (calc) — ABNORMAL HIGH (ref <130)
Total CHOL/HDL Ratio: 4.2 (calc) (ref <5.0)
Triglycerides: 69 mg/dL (ref <150)

## 2019-08-17 LAB — COMPLETE METABOLIC PANEL WITH GFR
AG Ratio: 1.6 (calc) (ref 1.0–2.5)
ALT: 28 U/L (ref 9–46)
AST: 28 U/L (ref 10–40)
Albumin: 4.6 g/dL (ref 3.6–5.1)
Alkaline phosphatase (APISO): 60 U/L (ref 36–130)
BUN/Creatinine Ratio: 23 (calc) — ABNORMAL HIGH (ref 6–22)
BUN: 26 mg/dL — ABNORMAL HIGH (ref 7–25)
CO2: 28 mmol/L (ref 20–32)
Calcium: 9.5 mg/dL (ref 8.6–10.3)
Chloride: 105 mmol/L (ref 98–110)
Creat: 1.15 mg/dL (ref 0.60–1.35)
GFR, Est African American: 90 mL/min/{1.73_m2} (ref >=60)
GFR, Est Non African American: 78 mL/min/{1.73_m2} (ref >=60)
Globulin: 2.8 g/dL (calc) (ref 1.9–3.7)
Glucose, Bld: 102 mg/dL — ABNORMAL HIGH (ref 65–99)
Potassium: 4.9 mmol/L (ref 3.5–5.3)
Sodium: 139 mmol/L (ref 135–146)
Total Bilirubin: 0.6 mg/dL (ref 0.2–1.2)
Total Protein: 7.4 g/dL (ref 6.1–8.1)

## 2019-08-17 NOTE — Assessment & Plan Note (Signed)
Well adult Orders Placed This Encounter  Procedures  . COMPLETE METABOLIC PANEL WITH GFR  . CBC  . Lipid Profile  Screening: Lipid profile Immunizations: UTD, had J&J COVID vaccine.  Anticipatory guidance/Risk factor reduction:  Continue healthy lifestyle. Additional recommendations per AVS.

## 2019-08-17 NOTE — Progress Notes (Signed)
Nathan Graham - 44 y.o. male MRN [id number]  Date of birth: January 19, 1976  Subjective Chief Complaint  Patient presents with  . Annual Exam    HPI Nathan Graham is a 44 y.o. male here today for annual exam.  He has been in fairly good health.  Had borderline HTN previously but this has improved after 40 lb weight loss.    He remains very active and exercises frequently.  He follows a healthy diet.    He is a non-smoker.  He consumes EtOH rarely.   Denies increased stress   Review of Systems  Constitutional: Negative for chills, fever, malaise/fatigue and weight loss.  HENT: Negative for congestion, ear pain and sore throat.   Eyes: Negative for blurred vision, double vision and pain.  Respiratory: Negative for cough and shortness of breath.   Cardiovascular: Negative for chest pain and palpitations.  Gastrointestinal: Negative for abdominal pain, blood in stool, constipation, heartburn and nausea.  Genitourinary: Negative for dysuria and urgency.  Musculoskeletal: Negative for joint pain and myalgias.  Neurological: Negative for dizziness and headaches.  Endo/Heme/Allergies: Does not bruise/bleed easily.  Psychiatric/Behavioral: Negative for depression. The patient is not nervous/anxious and does not have insomnia.     No Known Allergies  Past Medical History:  Diagnosis Date  . Family history of lung cancer   . GERD (gastroesophageal reflux disease)   . Hyperlipidemia   . Hypertension   . Perianal abscess 05/28/2017    Past Surgical History:  Procedure Laterality Date  . NO PAST SURGERIES      Social History   Socioeconomic History  . Marital status: Married    Spouse name: Not on file  . Number of children: Not on file  . Years of education: Not on file  . Highest education level: Not on file  Occupational History  . Not on file  Tobacco Use  . Smoking status: Never Smoker  . Smokeless tobacco: Never Used  Substance and Sexual Activity  . Alcohol use: Not  Currently  . Drug use: Never  . Sexual activity: Yes    Birth control/protection: None  Other Topics Concern  . Not on file  Social History Narrative  . Not on file   Social Determinants of Health   Financial Resource Strain:   . Difficulty of Paying Living Expenses:   Food Insecurity:   . Worried About Programme researcher, broadcasting/film/video in the Last Year:   . Barista in the Last Year:   Transportation Needs:   . Freight forwarder (Medical):   Marland Kitchen Lack of Transportation (Non-Medical):   Physical Activity:   . Days of Exercise per Week:   . Minutes of Exercise per Session:   Stress:   . Feeling of Stress :   Social Connections:   . Frequency of Communication with Friends and Family:   . Frequency of Social Gatherings with Friends and Family:   . Attends Religious Services:   . Active Member of Clubs or Organizations:   . Attends Banker Meetings:   Marland Kitchen Marital Status:     Family History  Problem Relation Age of Onset  . Hypertension Mother   . Hypothyroidism Mother   . Hypertension Father   . Hyperlipidemia Father   . Stroke Father   . Pancreatic cancer Father   . Pancreatic cancer Maternal Grandmother   . Basal cell carcinoma Brother   . Colon cancer Maternal Aunt   . Lung cancer Maternal Aunt  Health Maintenance  Topic Date Due  . HIV Screening  Never done  . INFLUENZA VACCINE  11/07/2019  . TETANUS/TDAP  03/22/2026     ----------------------------------------------------------------------------------------------------------------------------------------------------------------------------------------------------------------- Physical Exam There were no vitals taken for this visit.  Physical Exam Constitutional:      General: He is not in acute distress. HENT:     Head: Normocephalic and atraumatic.     Right Ear: Tympanic membrane and external ear normal.     Left Ear: Tympanic membrane and external ear normal.  Eyes:     General: No  scleral icterus. Neck:     Thyroid: No thyromegaly.  Cardiovascular:     Rate and Rhythm: Normal rate and regular rhythm.     Heart sounds: Normal heart sounds.  Pulmonary:     Effort: Pulmonary effort is normal.     Breath sounds: Normal breath sounds.  Abdominal:     General: Bowel sounds are normal. There is no distension.     Palpations: Abdomen is soft.     Tenderness: There is no abdominal tenderness. There is no guarding.  Musculoskeletal:     Cervical back: Normal range of motion.  Lymphadenopathy:     Cervical: No cervical adenopathy.  Skin:    General: Skin is warm and dry.     Findings: No rash.  Neurological:     Mental Status: He is alert and oriented to person, place, and time.     Cranial Nerves: No cranial nerve deficit.     Motor: No abnormal muscle tone.  Psychiatric:        Mood and Affect: Mood normal.        Behavior: Behavior normal.     ------------------------------------------------------------------------------------------------------------------------------------------------------------------------------------------------------------------- Assessment and Plan  Well adult exam Well adult Orders Placed This Encounter  Procedures  . COMPLETE METABOLIC PANEL WITH GFR  . CBC  . Lipid Profile  Screening: Lipid profile Immunizations: UTD, had J&J COVID vaccine.  Anticipatory guidance/Risk factor reduction:  Continue healthy lifestyle. Additional recommendations per AVS.    No orders of the defined types were placed in this encounter.   No follow-ups on file.    This visit occurred during the SARS-CoV-2 public health emergency.  Safety protocols were in place, including screening questions prior to the visit, additional usage of staff PPE, and extensive cleaning of exam room while observing appropriate contact time as indicated for disinfecting solutions.

## 2019-08-17 NOTE — Patient Instructions (Signed)
Great to meet you today! We'll be in touch with lab results You can attach form through mychart or drop off up front.     Preventive Care 44-43 Years Old, Male Preventive care refers to lifestyle choices and visits with your health care provider that can promote health and wellness. This includes:  A yearly physical exam. This is also called an annual well check.  Regular dental and eye exams.  Immunizations.  Screening for certain conditions.  Healthy lifestyle choices, such as eating a healthy diet, getting regular exercise, not using drugs or products that contain nicotine and tobacco, and limiting alcohol use. What can I expect for my preventive care visit? Physical exam Your health care provider will check:  Height and weight. These may be used to calculate body mass index (BMI), which is a measurement that tells if you are at a healthy weight.  Heart rate and blood pressure.  Your skin for abnormal spots. Counseling Your health care provider may ask you questions about:  Alcohol, tobacco, and drug use.  Emotional well-being.  Home and relationship well-being.  Sexual activity.  Eating habits.  Work and work Statistician. What immunizations do I need?  Influenza (flu) vaccine  This is recommended every year. Tetanus, diphtheria, and pertussis (Tdap) vaccine  You may need a Td booster every 10 years. Varicella (chickenpox) vaccine  You may need this vaccine if you have not already been vaccinated. Zoster (shingles) vaccine  You may need this after age 76. Measles, mumps, and rubella (MMR) vaccine  You may need at least one dose of MMR if you were born in 1957 or later. You may also need a second dose. Pneumococcal conjugate (PCV13) vaccine  You may need this if you have certain conditions and were not previously vaccinated. Pneumococcal polysaccharide (PPSV23) vaccine  You may need one or two doses if you smoke cigarettes or if you have certain  conditions. Meningococcal conjugate (MenACWY) vaccine  You may need this if you have certain conditions. Hepatitis A vaccine  You may need this if you have certain conditions or if you travel or work in places where you may be exposed to hepatitis A. Hepatitis B vaccine  You may need this if you have certain conditions or if you travel or work in places where you may be exposed to hepatitis B. Haemophilus influenzae type b (Hib) vaccine  You may need this if you have certain risk factors. Human papillomavirus (HPV) vaccine  If recommended by your health care provider, you may need three doses over 6 months. You may receive vaccines as individual doses or as more than one vaccine together in one shot (combination vaccines). Talk with your health care provider about the risks and benefits of combination vaccines. What tests do I need? Blood tests  Lipid and cholesterol levels. These may be checked every 5 years, or more frequently if you are over 36 years old.  Hepatitis C test.  Hepatitis B test. Screening  Lung cancer screening. You may have this screening every year starting at age 34 if you have a 30-pack-year history of smoking and currently smoke or have quit within the past 15 years.  Prostate cancer screening. Recommendations will vary depending on your family history and other risks.  Colorectal cancer screening. All adults should have this screening starting at age 23 and continuing until age 40. Your health care provider may recommend screening at age 19 if you are at increased risk. You will have tests every 1-10 years,  depending on your results and the type of screening test.  Diabetes screening. This is done by checking your blood sugar (glucose) after you have not eaten for a while (fasting). You may have this done every 1-3 years.  Sexually transmitted disease (STD) testing. Follow these instructions at home: Eating and drinking  Eat a diet that includes fresh  fruits and vegetables, whole grains, lean protein, and low-fat dairy products.  Take vitamin and mineral supplements as recommended by your health care provider.  Do not drink alcohol if your health care provider tells you not to drink.  If you drink alcohol: ? Limit how much you have to 0-2 drinks a day. ? Be aware of how much alcohol is in your drink. In the [place] one drink equals one 12 oz bottle of beer (355 mL), one 5 oz glass of wine (148 mL), or one 1 oz glass of hard liquor (44 mL). Lifestyle  Take daily care of your teeth and gums.  Stay active. Exercise for at least 30 minutes on 5 or more days each week.  Do not use any products that contain nicotine or tobacco, such as cigarettes, e-cigarettes, and chewing tobacco. If you need help quitting, ask your health care provider.  If you are sexually active, practice safe sex. Use a condom or other form of protection to prevent STIs (sexually transmitted infections).  Talk with your health care provider about taking a low-dose aspirin every day starting at age 75. What's next?  Go to your health care provider once a year for a well check visit.  Ask your health care provider how often you should have your eyes and teeth checked.  Stay up to date on all vaccines. This information is not intended to replace advice given to you by your health care provider. Make sure you discuss any questions you have with your health care provider. Document Revised: 03/19/2018 Document Reviewed: 03/19/2018 Elsevier Patient Education  2020 [employer].

## 2019-08-25 ENCOUNTER — Encounter: Payer: Self-pay | Admitting: Family Medicine

## 2021-07-23 ENCOUNTER — Ambulatory Visit (INDEPENDENT_AMBULATORY_CARE_PROVIDER_SITE_OTHER): Payer: 59 | Admitting: Sports Medicine

## 2021-07-23 ENCOUNTER — Ambulatory Visit (INDEPENDENT_AMBULATORY_CARE_PROVIDER_SITE_OTHER): Payer: 59

## 2021-07-23 DIAGNOSIS — M79642 Pain in left hand: Secondary | ICD-10-CM

## 2021-07-23 DIAGNOSIS — W19XXXA Unspecified fall, initial encounter: Secondary | ICD-10-CM

## 2021-07-23 MED ORDER — MELOXICAM 15 MG PO TABS: ORAL_TABLET | ORAL | 3 refills | Status: AC
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
## 2021-08-20 ENCOUNTER — Ambulatory Visit: Payer: 59 | Admitting: Sports Medicine

## 2022-09-06 ENCOUNTER — Encounter: Payer: Self-pay | Admitting: Family Medicine

## 2022-09-06 ENCOUNTER — Ambulatory Visit (INDEPENDENT_AMBULATORY_CARE_PROVIDER_SITE_OTHER): Payer: BC Managed Care – PPO | Admitting: Family Medicine

## 2022-09-06 VITALS — BP 136/85 | HR 58 | Ht 74.0 in | Wt 228.0 lb

## 2022-09-06 DIAGNOSIS — Z1211 Encounter for screening for malignant neoplasm of colon: Secondary | ICD-10-CM | POA: Diagnosis not present

## 2022-09-06 DIAGNOSIS — Z0000 Encounter for general adult medical examination without abnormal findings: Secondary | ICD-10-CM

## 2022-09-06 DIAGNOSIS — Z13220 Encounter for screening for lipoid disorders: Secondary | ICD-10-CM

## 2022-09-06 NOTE — Patient Instructions (Signed)

## 2022-09-06 NOTE — Assessment & Plan Note (Signed)
Well adult Orders Placed This Encounter  Procedures   COMPLETE METABOLIC PANEL WITH GFR   CBC with Differential   Lipid Panel w/reflex Direct LDL   Ambulatory referral to Gastroenterology    Referral Priority:   Routine    Referral Type:   Consultation    Referral Reason:   Specialty Services Required    Number of Visits Requested:   1  Screenings: per lab orders Immunizations: UTD Anticipatory guidance/Risk factor reduction:  Recommendations per AVS.

## 2022-09-06 NOTE — Progress Notes (Signed)
Nathan Graham - 47 y.o. male MRN [id number]  Date of birth: Nov 29, 1975  Subjective Chief Complaint  Patient presents with   Annual Exam    HPI Nathan Graham is a 47 y.o. male here today for annual exam.   He reports that he is doing well. Denies new concerns today. History of HTN but has been able to control with dietary changes.   He is fairly active.  Feels that diet is pretty good.   He is a non-smoker.  No EtOH at this time.   Due for colon cancer screening.   Review of Systems  Constitutional:  Negative for chills, fever, malaise/fatigue and weight loss.  HENT:  Negative for congestion, ear pain and sore throat.   Eyes:  Negative for blurred vision, double vision and pain.  Respiratory:  Negative for cough and shortness of breath.   Cardiovascular:  Negative for chest pain and palpitations.  Gastrointestinal:  Negative for abdominal pain, blood in stool, constipation, heartburn and nausea.  Genitourinary:  Negative for dysuria and urgency.  Musculoskeletal:  Negative for joint pain and myalgias.  Neurological:  Negative for dizziness and headaches.  Endo/Heme/Allergies:  Does not bruise/bleed easily.  Psychiatric/Behavioral:  Negative for depression. The patient is not nervous/anxious and does not have insomnia.       No Known Allergies  Past Medical History:  Diagnosis Date   Family history of lung cancer    GERD (gastroesophageal reflux disease)    Hyperlipidemia    Hypertension    Perianal abscess 05/28/2017    Past Surgical History:  Procedure Laterality Date   NO PAST SURGERIES      Social History   Socioeconomic History   Marital status: Married    Spouse name: Not on file   Number of children: Not on file   Years of education: Not on file   Highest education level: Not on file  Occupational History   Not on file  Tobacco Use   Smoking status: Never   Smokeless tobacco: Never  Vaping Use   Vaping Use: Never used  Substance and Sexual Activity    Alcohol use: Not Currently   Drug use: Never   Sexual activity: Yes    Birth control/protection: None  Other Topics Concern   Not on file  Social History Narrative   Not on file   Social Determinants of Health   Financial Resource Strain: Not on file  Food Insecurity: Not on file  Transportation Needs: Not on file  Physical Activity: Not on file  Stress: Not on file  Social Connections: Not on file    Family History  Problem Relation Age of Onset   Hypertension Mother    Hypothyroidism Mother    Hypertension Father    Hyperlipidemia Father    Stroke Father    Pancreatic cancer Father    Pancreatic cancer Maternal Grandmother    Basal cell carcinoma Brother    Colon cancer Maternal Aunt    Lung cancer Maternal Aunt     Health Maintenance  Topic Date Due   HIV Screening  Never done   Hepatitis C Screening  Never done   COVID-19 Vaccine (2 - 2023-24 season) 12/07/2021   Colonoscopy  Never done   INFLUENZA VACCINE  11/07/2022   DTaP/Tdap/Td (2 - Td or Tdap) 03/22/2026   HPV VACCINES  Aged Out     ----------------------------------------------------------------------------------------------------------------------------------------------------------------------------------------------------------------- Physical Exam BP 136/85   Pulse (!) 58   Ht 6\' 2"  (1.88 m)   Wt  228 lb (103.4 kg)   SpO2 99%   BMI 29.27 kg/m   Physical Exam Constitutional:      General: He is not in acute distress. HENT:     Head: Normocephalic and atraumatic.     Right Ear: Tympanic membrane and external ear normal.     Left Ear: Tympanic membrane and external ear normal.  Eyes:     General: No scleral icterus. Neck:     Thyroid: No thyromegaly.  Cardiovascular:     Rate and Rhythm: Normal rate and regular rhythm.     Heart sounds: Normal heart sounds.  Pulmonary:     Effort: Pulmonary effort is normal.     Breath sounds: Normal breath sounds.  Abdominal:     General: Bowel  sounds are normal. There is no distension.     Palpations: Abdomen is soft.     Tenderness: There is no abdominal tenderness. There is no guarding.  Musculoskeletal:     Cervical back: Normal range of motion.  Lymphadenopathy:     Cervical: No cervical adenopathy.  Skin:    General: Skin is warm and dry.     Findings: No rash.  Neurological:     Mental Status: He is alert and oriented to person, place, and time.     Cranial Nerves: No cranial nerve deficit.     Motor: No abnormal muscle tone.  Psychiatric:        Mood and Affect: Mood normal.        Behavior: Behavior normal.     ------------------------------------------------------------------------------------------------------------------------------------------------------------------------------------------------------------------- Assessment and Plan  Well adult exam Well adult Orders Placed This Encounter  Procedures   COMPLETE METABOLIC PANEL WITH GFR   CBC with Differential   Lipid Panel w/reflex Direct LDL   Ambulatory referral to Gastroenterology    Referral Priority:   Routine    Referral Type:   Consultation    Referral Reason:   Specialty Services Required    Number of Visits Requested:   1  Screenings: per lab orders Immunizations: UTD Anticipatory guidance/Risk factor reduction:  Recommendations per AVS.    No orders of the defined types were placed in this encounter.   No follow-ups on file.    This visit occurred during the SARS-CoV-2 public health emergency.  Safety protocols were in place, including screening questions prior to the visit, additional usage of staff PPE, and extensive cleaning of exam room while observing appropriate contact time as indicated for disinfecting solutions.

## 2022-09-07 LAB — CBC WITH DIFFERENTIAL/PLATELET
Absolute Monocytes: 563 cells/uL (ref 200–950)
Basophils Absolute: 50 cells/uL (ref 0–200)
Basophils Relative: 1.1 %
Eosinophils Absolute: 99 cells/uL (ref 15–500)
Eosinophils Relative: 2.2 %
HCT: 47.7 % (ref 38.5–50.0)
Hemoglobin: 15.6 g/dL (ref 13.2–17.1)
Lymphs Abs: 1013 cells/uL (ref 850–3900)
MCH: 30.9 pg (ref 27.0–33.0)
MCHC: 32.7 g/dL (ref 32.0–36.0)
MCV: 94.5 fL (ref 80.0–100.0)
MPV: 10.6 fL (ref 7.5–12.5)
Monocytes Relative: 12.5 %
Neutro Abs: 2777 cells/uL (ref 1500–7800)
Neutrophils Relative %: 61.7 %
Platelets: 211 10*3/uL (ref 140–400)
RBC: 5.05 10*6/uL (ref 4.20–5.80)
RDW: 12.4 % (ref 11.0–15.0)
Total Lymphocyte: 22.5 %
WBC: 4.5 10*3/uL (ref 3.8–10.8)

## 2022-09-07 LAB — COMPLETE METABOLIC PANEL WITH GFR
AG Ratio: 1.7 (calc) (ref 1.0–2.5)
ALT: 19 U/L (ref 9–46)
AST: 18 U/L (ref 10–40)
Albumin: 4.8 g/dL (ref 3.6–5.1)
Alkaline phosphatase (APISO): 42 U/L (ref 36–130)
BUN: 21 mg/dL (ref 7–25)
CO2: 29 mmol/L (ref 20–32)
Calcium: 9.5 mg/dL (ref 8.6–10.3)
Chloride: 104 mmol/L (ref 98–110)
Creat: 1.11 mg/dL (ref 0.60–1.29)
Globulin: 2.8 g/dL (calc) (ref 1.9–3.7)
Glucose, Bld: 103 mg/dL — ABNORMAL HIGH (ref 65–99)
Potassium: 4.6 mmol/L (ref 3.5–5.3)
Sodium: 139 mmol/L (ref 135–146)
Total Bilirubin: 0.9 mg/dL (ref 0.2–1.2)
Total Protein: 7.6 g/dL (ref 6.1–8.1)
eGFR: 83 mL/min/{1.73_m2} (ref >=60)

## 2022-09-07 LAB — LIPID PANEL W/REFLEX DIRECT LDL
Cholesterol: 232 mg/dL — ABNORMAL HIGH (ref <200)
HDL: 58 mg/dL (ref >=40)
LDL Cholesterol (Calc): 147 mg/dL (calc) — ABNORMAL HIGH
Non-HDL Cholesterol (Calc): 174 mg/dL (calc) — ABNORMAL HIGH (ref <130)
Total CHOL/HDL Ratio: 4 (calc) (ref <5.0)
Triglycerides: 141 mg/dL (ref <150)

## 2023-01-29 DIAGNOSIS — K648 Other hemorrhoids: Secondary | ICD-10-CM | POA: Diagnosis not present

## 2023-01-29 DIAGNOSIS — Z1211 Encounter for screening for malignant neoplasm of colon: Secondary | ICD-10-CM | POA: Diagnosis not present

## 2023-02-15 IMAGING — DX DG HAND COMPLETE 3+V*L*
3 series · 3 of 3 positions shown · non-contrast
Comparison: None.

CLINICAL DATA: Left 5th metacarpal/hamate pain. Status post fall
onto outstretched hand 6 weeks ago.

EXAM:
LEFT HAND - COMPLETE 3+ VIEW

[hand pa]
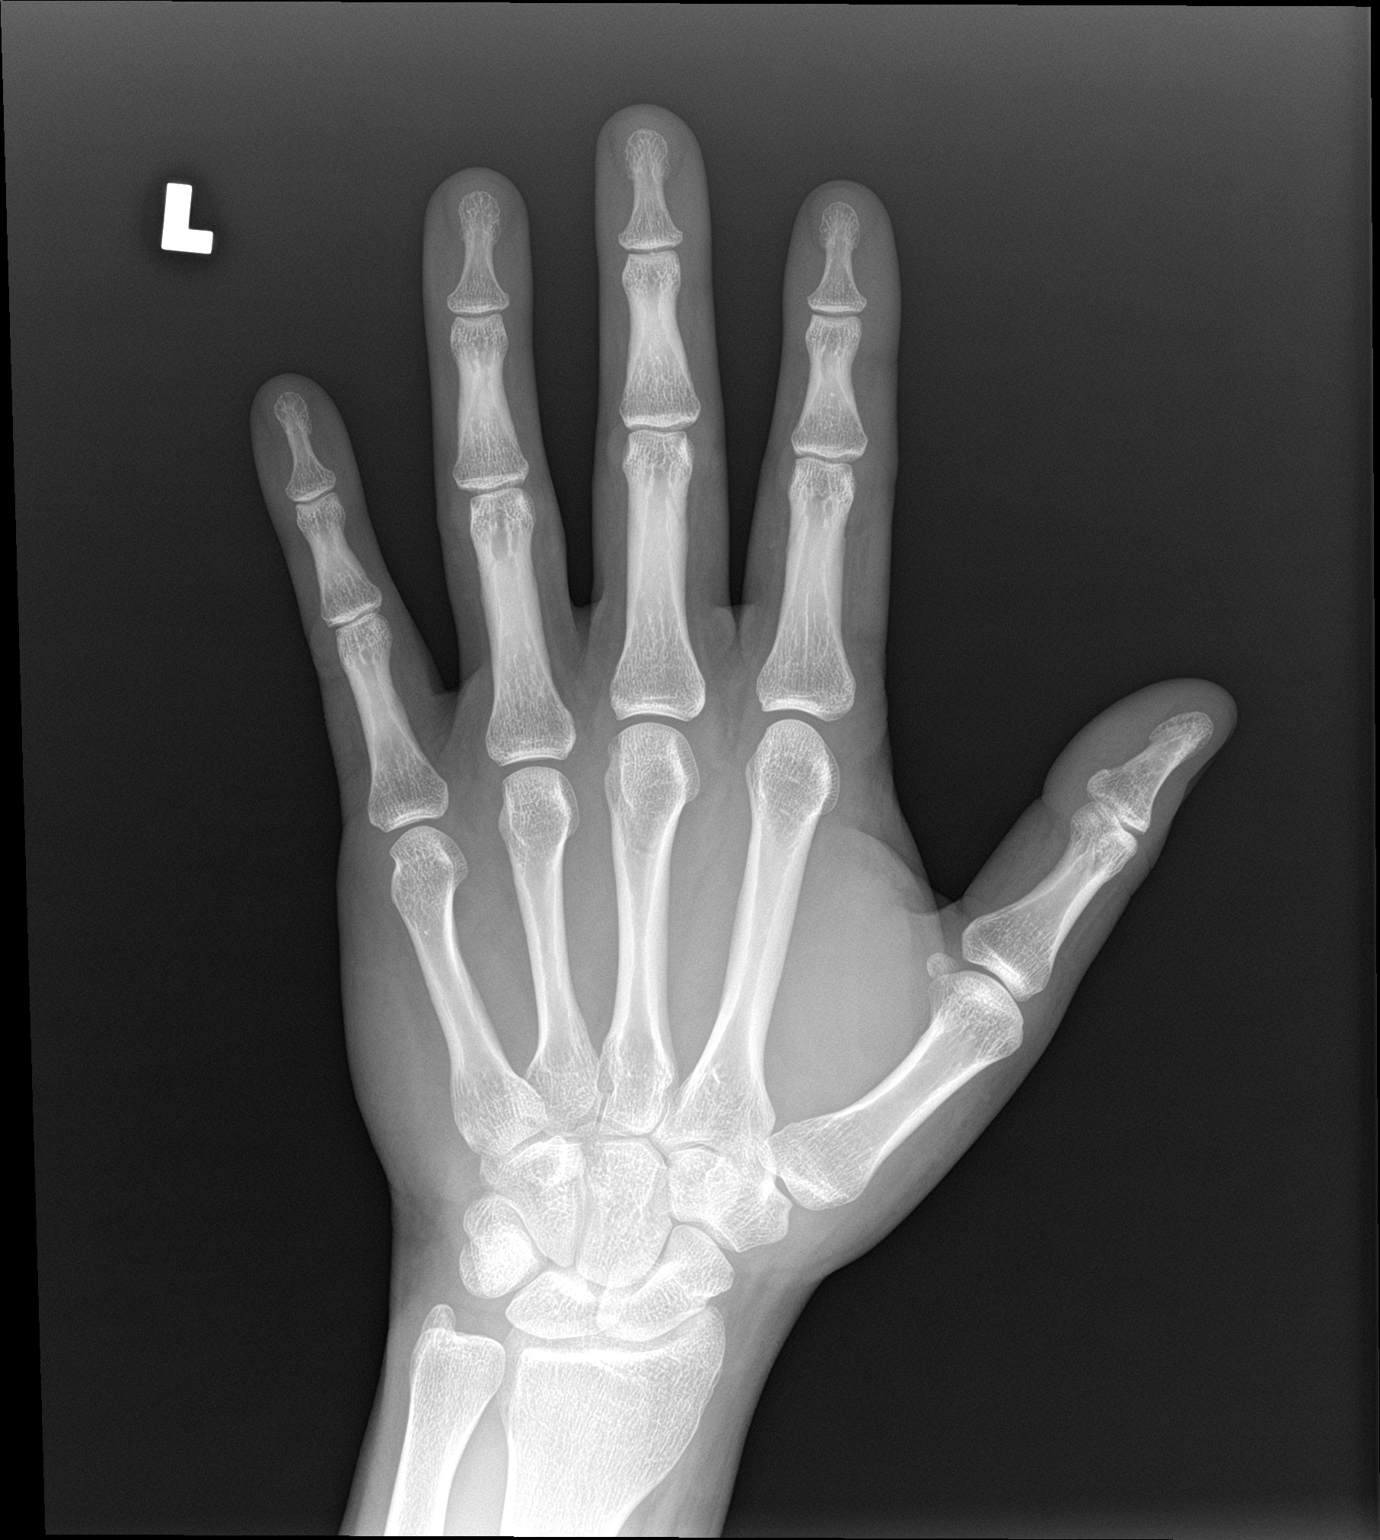

[hand obl]
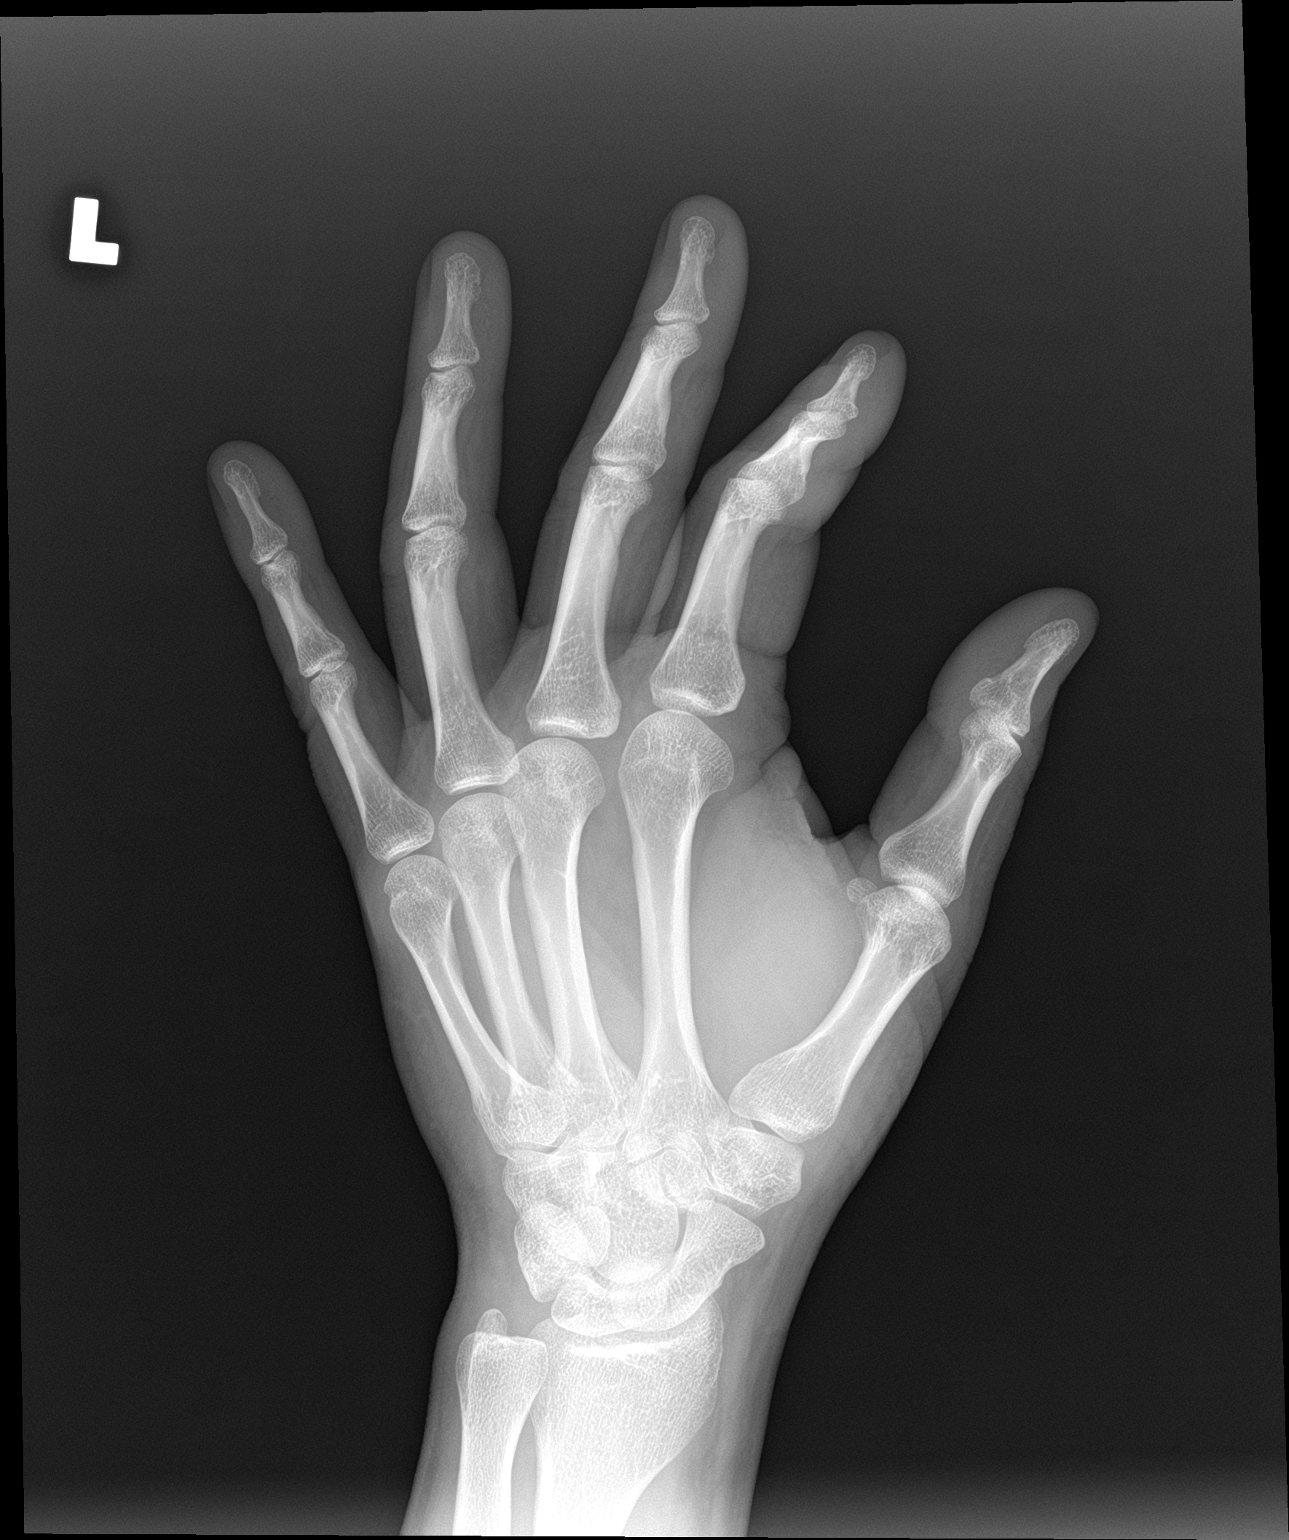

[hand lat]
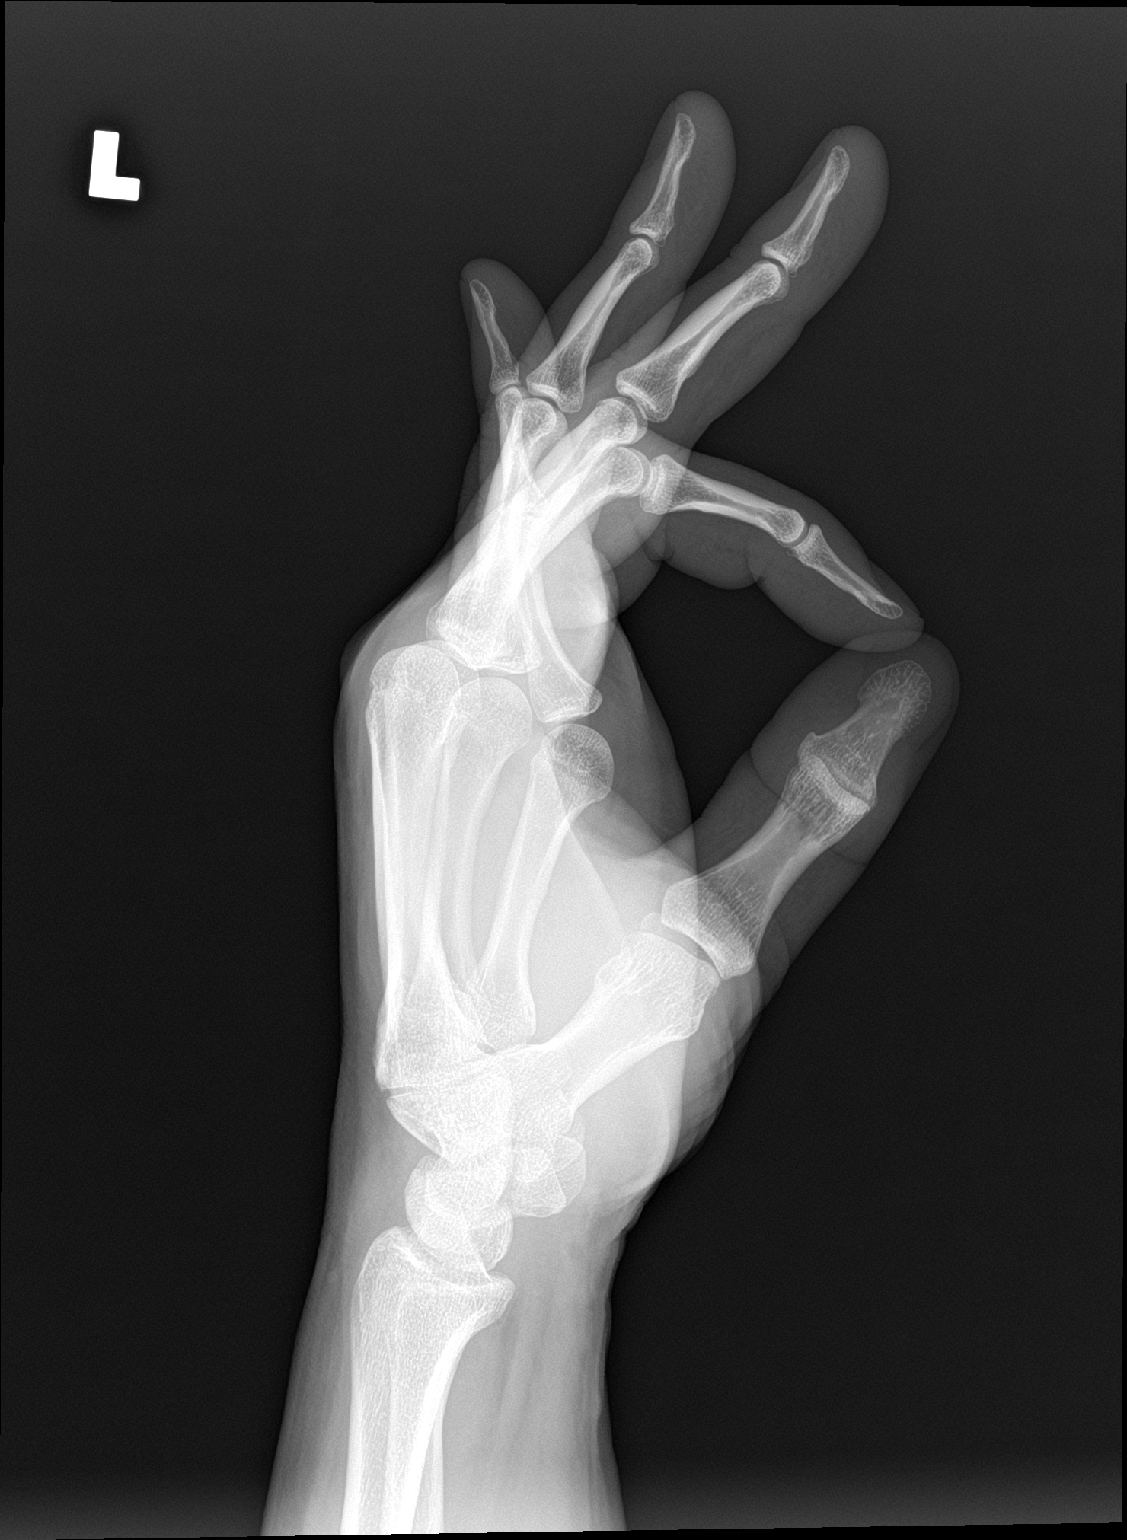

[3 of 3 positions shown; findings below may reference images not displayed]

FINDINGS: Normal alignment. No acute fracture. Normal mineralization. The soft
tissues are unremarkable.
IMPRESSION: No malalignment or acute fracture.

## 2023-12-09 ENCOUNTER — Encounter: Payer: Self-pay | Admitting: Sports Medicine
